# Patient Record
Sex: Female | Born: 1950 | Race: White | Hispanic: No | Marital: Single | Smoking: Never smoker
Health system: Southern US, Community
[De-identification: ages and names within clinical notes are randomized; demographics above are authoritative.]

## PROBLEM LIST (undated history)

## (undated) DIAGNOSIS — R140 Abdominal distension (gaseous): Secondary | ICD-10-CM

## (undated) DIAGNOSIS — R1115 Cyclical vomiting syndrome unrelated to migraine: Secondary | ICD-10-CM

## (undated) DIAGNOSIS — M8580 Other specified disorders of bone density and structure, unspecified site: Secondary | ICD-10-CM

## (undated) DIAGNOSIS — H59039 Cystoid macular edema following cataract surgery, unspecified eye: Secondary | ICD-10-CM

## (undated) DIAGNOSIS — Z8719 Personal history of other diseases of the digestive system: Secondary | ICD-10-CM

## (undated) DIAGNOSIS — K219 Gastro-esophageal reflux disease without esophagitis: Secondary | ICD-10-CM

## (undated) DIAGNOSIS — E785 Hyperlipidemia, unspecified: Secondary | ICD-10-CM

## (undated) DIAGNOSIS — M1990 Unspecified osteoarthritis, unspecified site: Secondary | ICD-10-CM

## (undated) DIAGNOSIS — G43109 Migraine with aura, not intractable, without status migrainosus: Secondary | ICD-10-CM

## (undated) DIAGNOSIS — E039 Hypothyroidism, unspecified: Secondary | ICD-10-CM

## (undated) DIAGNOSIS — F32A Depression, unspecified: Secondary | ICD-10-CM

## (undated) DIAGNOSIS — K2970 Gastritis, unspecified, without bleeding: Secondary | ICD-10-CM

## (undated) DIAGNOSIS — B9681 Helicobacter pylori [H. pylori] as the cause of diseases classified elsewhere: Secondary | ICD-10-CM

## (undated) DIAGNOSIS — F419 Anxiety disorder, unspecified: Secondary | ICD-10-CM

## (undated) DIAGNOSIS — Z87898 Personal history of other specified conditions: Secondary | ICD-10-CM

## (undated) DIAGNOSIS — R112 Nausea with vomiting, unspecified: Secondary | ICD-10-CM

## (undated) DIAGNOSIS — F329 Major depressive disorder, single episode, unspecified: Secondary | ICD-10-CM

## (undated) HISTORY — DX: Nausea with vomiting, unspecified: R11.2

## (undated) HISTORY — DX: Cystoid macular edema following cataract surgery, unspecified eye: H59.039

## (undated) HISTORY — DX: Cyclical vomiting syndrome unrelated to migraine: R11.15

## (undated) HISTORY — DX: Depression, unspecified: F32.A

## (undated) HISTORY — DX: Helicobacter pylori (H. pylori) as the cause of diseases classified elsewhere: B96.81

## (undated) HISTORY — DX: Other specified disorders of bone density and structure, unspecified site: M85.80

## (undated) HISTORY — DX: Personal history of other specified conditions: Z87.898

## (undated) HISTORY — DX: Migraine with aura, not intractable, without status migrainosus: G43.109

## (undated) HISTORY — DX: Gastro-esophageal reflux disease without esophagitis: K21.9

## (undated) HISTORY — DX: Hypothyroidism, unspecified: E03.9

## (undated) HISTORY — DX: Unspecified osteoarthritis, unspecified site: M19.90

## (undated) HISTORY — DX: Gastritis, unspecified, without bleeding: K29.70

## (undated) HISTORY — DX: Personal history of other diseases of the digestive system: Z87.19

## (undated) HISTORY — DX: Anxiety disorder, unspecified: F41.9

## (undated) HISTORY — DX: Major depressive disorder, single episode, unspecified: F32.9

## (undated) HISTORY — DX: Hyperlipidemia, unspecified: E78.5

## (undated) HISTORY — DX: Abdominal distension (gaseous): R14.0

---
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
## 2002-10-20 ENCOUNTER — Other Ambulatory Visit: Admission: RE | Admit: 2002-10-20 | Discharge: 2002-10-20 | Payer: Self-pay | Admitting: Family Medicine

## 2003-11-12 ENCOUNTER — Other Ambulatory Visit: Admission: RE | Admit: 2003-11-12 | Discharge: 2003-11-12 | Payer: Self-pay | Admitting: Family Medicine

## 2003-12-10 LAB — HM COLONOSCOPY

## 2004-05-12 ENCOUNTER — Ambulatory Visit: Payer: Self-pay | Admitting: Family Medicine

## 2004-05-17 ENCOUNTER — Ambulatory Visit: Payer: Self-pay | Admitting: Internal Medicine

## 2004-05-18 ENCOUNTER — Encounter: Admission: RE | Admit: 2004-05-18 | Discharge: 2004-05-18 | Payer: Self-pay | Admitting: Family Medicine

## 2004-09-27 ENCOUNTER — Ambulatory Visit: Payer: Self-pay | Admitting: Family Medicine

## 2004-12-27 ENCOUNTER — Ambulatory Visit: Payer: Self-pay | Admitting: Family Medicine

## 2005-01-03 ENCOUNTER — Ambulatory Visit: Payer: Self-pay | Admitting: Family Medicine

## 2005-01-03 ENCOUNTER — Other Ambulatory Visit: Admission: RE | Admit: 2005-01-03 | Discharge: 2005-01-03 | Payer: Self-pay | Admitting: Family Medicine

## 2005-03-19 ENCOUNTER — Ambulatory Visit: Payer: Self-pay | Admitting: Family Medicine

## 2005-04-18 ENCOUNTER — Ambulatory Visit: Payer: Self-pay | Admitting: Family Medicine

## 2005-04-27 ENCOUNTER — Ambulatory Visit: Payer: Self-pay | Admitting: Family Medicine

## 2005-10-12 ENCOUNTER — Ambulatory Visit: Payer: Self-pay | Admitting: Family Medicine

## 2006-01-04 ENCOUNTER — Ambulatory Visit: Payer: Self-pay | Admitting: Family Medicine

## 2006-01-11 ENCOUNTER — Encounter: Payer: Self-pay | Admitting: Family Medicine

## 2006-01-11 ENCOUNTER — Ambulatory Visit: Payer: Self-pay | Admitting: Family Medicine

## 2006-01-11 ENCOUNTER — Other Ambulatory Visit: Admission: RE | Admit: 2006-01-11 | Discharge: 2006-01-11 | Payer: Self-pay | Admitting: Family Medicine

## 2006-06-14 ENCOUNTER — Ambulatory Visit: Payer: Self-pay | Admitting: Family Medicine

## 2007-01-29 ENCOUNTER — Ambulatory Visit: Payer: Self-pay | Admitting: Family Medicine

## 2007-01-30 LAB — CONVERTED CEMR LAB
ALT: 24 units/L (ref 0–35)
AST: 21 units/L (ref 0–37)
Albumin: 4 g/dL (ref 3.5–5.2)
Alkaline Phosphatase: 50 units/L (ref 39–117)
BUN: 7 mg/dL (ref 6–23)
Basophils Absolute: 0 10*3/uL (ref 0.0–0.1)
Basophils Relative: 0.4 % (ref 0.0–1.0)
Bilirubin, Direct: 0.1 mg/dL (ref 0.0–0.3)
CO2: 32 meq/L (ref 19–32)
Calcium: 9.3 mg/dL (ref 8.4–10.5)
Chloride: 106 meq/L (ref 96–112)
Cholesterol: 177 mg/dL (ref 0–200)
Creatinine, Ser: 0.7 mg/dL (ref 0.4–1.2)
Eosinophils Absolute: 0.3 10*3/uL (ref 0.0–0.6)
Eosinophils Relative: 5.9 % — ABNORMAL HIGH (ref 0.0–5.0)
GFR calc Af Amer: 111 mL/min
GFR calc non Af Amer: 92 mL/min
Glucose, Bld: 100 mg/dL — ABNORMAL HIGH (ref 70–99)
HCT: 41.5 % (ref 36.0–46.0)
HDL: 43.2 mg/dL (ref >39.0)
Hemoglobin: 14.8 g/dL (ref 12.0–15.0)
LDL Cholesterol: 108 mg/dL — ABNORMAL HIGH (ref 0–99)
Lymphocytes Relative: 35.7 % (ref 12.0–46.0)
MCHC: 35.6 g/dL (ref 30.0–36.0)
MCV: 91.5 fL (ref 78.0–100.0)
Monocytes Absolute: 0.4 10*3/uL (ref 0.2–0.7)
Monocytes Relative: 8.9 % (ref 3.0–11.0)
Neutro Abs: 2.3 10*3/uL (ref 1.4–7.7)
Neutrophils Relative %: 49.1 % (ref 43.0–77.0)
Platelets: 208 10*3/uL (ref 150–400)
Potassium: 3.9 meq/L (ref 3.5–5.1)
RBC: 4.53 M/uL (ref 3.87–5.11)
RDW: 12.4 % (ref 11.5–14.6)
Sodium: 144 meq/L (ref 135–145)
TSH: 1.03 microintl units/mL (ref 0.35–5.50)
Total Bilirubin: 0.9 mg/dL (ref 0.3–1.2)
Total CHOL/HDL Ratio: 4.1
Total Protein: 6.7 g/dL (ref 6.0–8.3)
Triglycerides: 129 mg/dL (ref 0–149)
VLDL: 26 mg/dL (ref 0–40)
WBC: 4.6 10*3/uL (ref 4.5–10.5)

## 2007-02-05 ENCOUNTER — Other Ambulatory Visit: Admission: RE | Admit: 2007-02-05 | Discharge: 2007-02-05 | Payer: Self-pay | Admitting: Family Medicine

## 2007-02-05 ENCOUNTER — Encounter: Payer: Self-pay | Admitting: Family Medicine

## 2007-02-05 ENCOUNTER — Ambulatory Visit: Payer: Self-pay | Admitting: Family Medicine

## 2007-03-10 ENCOUNTER — Ambulatory Visit: Payer: Self-pay | Admitting: Family Medicine

## 2007-03-10 DIAGNOSIS — R110 Nausea: Secondary | ICD-10-CM

## 2007-06-30 ENCOUNTER — Ambulatory Visit: Payer: Self-pay | Admitting: Family Medicine

## 2007-06-30 DIAGNOSIS — R1013 Epigastric pain: Secondary | ICD-10-CM

## 2007-06-30 DIAGNOSIS — K219 Gastro-esophageal reflux disease without esophagitis: Secondary | ICD-10-CM | POA: Insufficient documentation

## 2007-07-09 ENCOUNTER — Encounter: Admission: RE | Admit: 2007-07-09 | Discharge: 2007-07-09 | Payer: Self-pay | Admitting: Family Medicine

## 2007-09-15 ENCOUNTER — Telehealth: Payer: Self-pay | Admitting: Family Medicine

## 2008-01-20 ENCOUNTER — Ambulatory Visit: Payer: Self-pay | Admitting: Family Medicine

## 2008-01-21 LAB — CONVERTED CEMR LAB
AST: 19 units/L (ref 0–37)
Albumin: 4 g/dL (ref 3.5–5.2)
Alkaline Phosphatase: 47 units/L (ref 39–117)
BUN: 11 mg/dL (ref 6–23)
Basophils Relative: 0.9 % (ref 0.0–3.0)
Blood in Urine, dipstick: NEGATIVE
Chloride: 111 meq/L (ref 96–112)
Creatinine, Ser: 0.8 mg/dL (ref 0.4–1.2)
Eosinophils Relative: 5.8 % — ABNORMAL HIGH (ref 0.0–5.0)
Glucose, Bld: 96 mg/dL (ref 70–99)
Glucose, Urine, Semiquant: NEGATIVE
HCT: 40.5 % (ref 36.0–46.0)
HDL: 31.7 mg/dL — ABNORMAL LOW (ref >39.0)
MCV: 93 fL (ref 78.0–100.0)
Monocytes Absolute: 0.4 10*3/uL (ref 0.1–1.0)
Monocytes Relative: 9.1 % (ref 3.0–12.0)
Neutrophils Relative %: 50.8 % (ref 43.0–77.0)
Nitrite: NEGATIVE
Platelets: 202 10*3/uL (ref 150–400)
Potassium: 3.9 meq/L (ref 3.5–5.1)
RBC: 4.35 M/uL (ref 3.87–5.11)
Specific Gravity, Urine: 1.015
TSH: 0.73 microintl units/mL (ref 0.35–5.50)
Total CHOL/HDL Ratio: 5.8
Total Protein: 6.6 g/dL (ref 6.0–8.3)
WBC: 4.3 10*3/uL — ABNORMAL LOW (ref 4.5–10.5)
pH: 7.5

## 2008-01-27 ENCOUNTER — Other Ambulatory Visit: Admission: RE | Admit: 2008-01-27 | Discharge: 2008-01-27 | Payer: Self-pay | Admitting: Family Medicine

## 2008-01-27 ENCOUNTER — Encounter: Payer: Self-pay | Admitting: Family Medicine

## 2008-01-27 ENCOUNTER — Ambulatory Visit: Payer: Self-pay | Admitting: Family Medicine

## 2008-01-27 DIAGNOSIS — E039 Hypothyroidism, unspecified: Secondary | ICD-10-CM

## 2008-01-28 ENCOUNTER — Ambulatory Visit: Payer: Self-pay | Admitting: Family Medicine

## 2008-01-28 ENCOUNTER — Encounter: Payer: Self-pay | Admitting: Family Medicine

## 2008-02-13 ENCOUNTER — Encounter: Payer: Self-pay | Admitting: Family Medicine

## 2008-03-26 ENCOUNTER — Ambulatory Visit: Payer: Self-pay | Admitting: Family Medicine

## 2008-03-26 DIAGNOSIS — M542 Cervicalgia: Secondary | ICD-10-CM

## 2008-06-10 ENCOUNTER — Ambulatory Visit: Payer: Self-pay | Admitting: Family Medicine

## 2008-06-10 DIAGNOSIS — N952 Postmenopausal atrophic vaginitis: Secondary | ICD-10-CM

## 2008-06-10 DIAGNOSIS — B373 Candidiasis of vulva and vagina: Secondary | ICD-10-CM

## 2008-06-23 ENCOUNTER — Telehealth: Payer: Self-pay | Admitting: Family Medicine

## 2008-10-21 ENCOUNTER — Telehealth: Payer: Self-pay | Admitting: Family Medicine

## 2008-10-22 ENCOUNTER — Telehealth: Payer: Self-pay | Admitting: Family Medicine

## 2008-12-16 ENCOUNTER — Ambulatory Visit: Payer: Self-pay | Admitting: Family Medicine

## 2008-12-16 DIAGNOSIS — R319 Hematuria, unspecified: Secondary | ICD-10-CM

## 2008-12-16 LAB — CONVERTED CEMR LAB
Nitrite: POSITIVE
Urobilinogen, UA: 0.2

## 2009-01-25 ENCOUNTER — Telehealth: Payer: Self-pay | Admitting: Family Medicine

## 2009-02-07 ENCOUNTER — Ambulatory Visit: Payer: Self-pay | Admitting: Family Medicine

## 2009-02-07 DIAGNOSIS — N390 Urinary tract infection, site not specified: Secondary | ICD-10-CM

## 2009-02-07 LAB — CONVERTED CEMR LAB
Blood in Urine, dipstick: NEGATIVE
Ketones, urine, test strip: NEGATIVE
Nitrite: NEGATIVE
Protein, U semiquant: NEGATIVE
Specific Gravity, Urine: 1.02
Urobilinogen, UA: 0.2

## 2009-02-09 ENCOUNTER — Encounter: Payer: Self-pay | Admitting: Family Medicine

## 2009-03-02 ENCOUNTER — Ambulatory Visit: Payer: Self-pay | Admitting: Family Medicine

## 2009-03-02 LAB — CONVERTED CEMR LAB
Bilirubin Urine: NEGATIVE
Ketones, urine, test strip: NEGATIVE
Protein, U semiquant: NEGATIVE
Urobilinogen, UA: 0.2

## 2009-03-03 LAB — CONVERTED CEMR LAB
BUN: 12 mg/dL (ref 6–23)
Bilirubin, Direct: 0 mg/dL (ref 0.0–0.3)
CO2: 30 meq/L (ref 19–32)
Chloride: 103 meq/L (ref 96–112)
Cholesterol: 165 mg/dL (ref 0–200)
Creatinine, Ser: 0.8 mg/dL (ref 0.4–1.2)
Eosinophils Absolute: 0.2 10*3/uL (ref 0.0–0.7)
HDL: 43.5 mg/dL (ref >39.00)
LDL Cholesterol: 103 mg/dL — ABNORMAL HIGH (ref 0–99)
MCHC: 34.8 g/dL (ref 30.0–36.0)
MCV: 95.2 fL (ref 78.0–100.0)
Monocytes Absolute: 0.5 10*3/uL (ref 0.1–1.0)
Neutrophils Relative %: 48.9 % (ref 43.0–77.0)
Platelets: 178 10*3/uL (ref 150.0–400.0)
Total Bilirubin: 1.1 mg/dL (ref 0.3–1.2)
Triglycerides: 92 mg/dL (ref 0.0–149.0)
WBC: 4.1 10*3/uL — ABNORMAL LOW (ref 4.5–10.5)

## 2009-03-08 ENCOUNTER — Ambulatory Visit: Payer: Self-pay | Admitting: Family Medicine

## 2009-03-08 ENCOUNTER — Encounter: Payer: Self-pay | Admitting: Family Medicine

## 2009-03-08 ENCOUNTER — Other Ambulatory Visit: Admission: RE | Admit: 2009-03-08 | Discharge: 2009-03-08 | Payer: Self-pay | Admitting: Family Medicine

## 2009-03-11 ENCOUNTER — Encounter: Payer: Self-pay | Admitting: Family Medicine

## 2009-03-15 ENCOUNTER — Telehealth (INDEPENDENT_AMBULATORY_CARE_PROVIDER_SITE_OTHER): Payer: Self-pay | Admitting: *Deleted

## 2009-04-01 ENCOUNTER — Encounter (INDEPENDENT_AMBULATORY_CARE_PROVIDER_SITE_OTHER): Payer: Self-pay | Admitting: *Deleted

## 2009-06-20 ENCOUNTER — Ambulatory Visit: Payer: Self-pay | Admitting: Family Medicine

## 2009-06-20 DIAGNOSIS — L259 Unspecified contact dermatitis, unspecified cause: Secondary | ICD-10-CM

## 2009-08-29 ENCOUNTER — Ambulatory Visit: Payer: Self-pay | Admitting: Family Medicine

## 2009-08-29 DIAGNOSIS — IMO0002 Reserved for concepts with insufficient information to code with codable children: Secondary | ICD-10-CM | POA: Insufficient documentation

## 2010-04-18 ENCOUNTER — Encounter: Payer: Self-pay | Admitting: Family Medicine

## 2010-05-30 ENCOUNTER — Telehealth: Payer: Self-pay | Admitting: Family Medicine

## 2010-06-09 ENCOUNTER — Other Ambulatory Visit: Payer: Self-pay | Admitting: Family Medicine

## 2010-06-09 ENCOUNTER — Ambulatory Visit
Admission: RE | Admit: 2010-06-09 | Discharge: 2010-06-09 | Payer: Self-pay | Source: Home / Self Care | Attending: Family Medicine | Admitting: Family Medicine

## 2010-06-09 LAB — CBC WITH DIFFERENTIAL/PLATELET
Basophils Absolute: 0 10*3/uL (ref 0.0–0.1)
Basophils Relative: 0.8 % (ref 0.0–3.0)
Eosinophils Absolute: 0.3 10*3/uL (ref 0.0–0.7)
Eosinophils Relative: 5.9 % — ABNORMAL HIGH (ref 0.0–5.0)
HCT: 41 % (ref 36.0–46.0)
Hemoglobin: 14.1 g/dL (ref 12.0–15.0)
Lymphocytes Relative: 42.5 % (ref 12.0–46.0)
Lymphs Abs: 2.1 10*3/uL (ref 0.7–4.0)
MCHC: 34.3 g/dL (ref 30.0–36.0)
MCV: 95.3 fl (ref 78.0–100.0)
Monocytes Absolute: 0.5 10*3/uL (ref 0.1–1.0)
Monocytes Relative: 10.6 % (ref 3.0–12.0)
Neutro Abs: 2 10*3/uL (ref 1.4–7.7)
Neutrophils Relative %: 40.2 % — ABNORMAL LOW (ref 43.0–77.0)
Platelets: 199 10*3/uL (ref 150.0–400.0)
RBC: 4.3 Mil/uL (ref 3.87–5.11)
RDW: 13.3 % (ref 11.5–14.6)
WBC: 4.9 10*3/uL (ref 4.5–10.5)

## 2010-06-09 LAB — HEPATIC FUNCTION PANEL
ALT: 21 U/L (ref 0–35)
AST: 22 U/L (ref 0–37)
Albumin: 4 g/dL (ref 3.5–5.2)
Alkaline Phosphatase: 43 U/L (ref 39–117)
Bilirubin, Direct: 0.1 mg/dL (ref 0.0–0.3)
Total Bilirubin: 0.9 mg/dL (ref 0.3–1.2)
Total Protein: 6.4 g/dL (ref 6.0–8.3)

## 2010-06-09 LAB — LIPID PANEL
Cholesterol: 286 mg/dL — ABNORMAL HIGH (ref 0–200)
HDL: 47.1 mg/dL (ref >39.00)
Total CHOL/HDL Ratio: 6
Triglycerides: 126 mg/dL (ref 0.0–149.0)
VLDL: 25.2 mg/dL (ref 0.0–40.0)

## 2010-06-09 LAB — CONVERTED CEMR LAB
Bilirubin Urine: NEGATIVE
Glucose, Urine, Semiquant: NEGATIVE
Ketones, urine, test strip: NEGATIVE
Nitrite: NEGATIVE
Specific Gravity, Urine: 1.025
pH: 5.5

## 2010-06-09 LAB — BASIC METABOLIC PANEL
BUN: 11 mg/dL (ref 6–23)
CO2: 31 mEq/L (ref 19–32)
Calcium: 9.4 mg/dL (ref 8.4–10.5)
Chloride: 104 mEq/L (ref 96–112)
Creatinine, Ser: 0.9 mg/dL (ref 0.4–1.2)
GFR: 70.68 mL/min (ref >60.00)
Glucose, Bld: 85 mg/dL (ref 70–99)
Potassium: 4.1 mEq/L (ref 3.5–5.1)
Sodium: 142 mEq/L (ref 135–145)

## 2010-06-09 LAB — TSH: TSH: 2.3 u[IU]/mL (ref 0.35–5.50)

## 2010-06-09 LAB — LDL CHOLESTEROL, DIRECT: Direct LDL: 224.1 mg/dL

## 2010-06-27 ENCOUNTER — Encounter: Payer: Self-pay | Admitting: Family Medicine

## 2010-06-29 NOTE — Assessment & Plan Note (Signed)
Summary: ab and back pain//ccm   Vital Signs:  Patient profile:   60 year old female Weight:      143 pounds Temp:     98.1 degrees F BP sitting:   110 / 74  (left arm) Cuff size:   regular  Vitals Entered By: Duard Brady LPN (August 29, 1608 9:13 AM) CC: c/o (R) back and flank pain x 2 days   otc not helping Is Patient Diabetic? No   History of Present Illness: 5 days ago while doing some hard exercises in her core class she had a sudden sharp pain in the right mid bakc and right flank. This has persisted since.  Ice and Motrin help. No change in bowel or urinary habits. No fever or nausea. Appetite is good, and eating has no effect on the pain.   Preventive Screening-Counseling & Management  Alcohol-Tobacco     Smoking Status: never  Allergies (verified): No Known Drug Allergies  Past History:  Past Medical History: Reviewed history from 01/27/2008 and no changes required. Depression High Cholesterol UTI Hypothyroidism Anxiety GERD  Past Surgical History: Reviewed history from 01/27/2008 and no changes required. Colonoscopy 12-10-03 per Dr. Leone Payor, repeat in 10 yrs  Review of Systems  The patient denies anorexia, fever, weight loss, weight gain, vision loss, decreased hearing, hoarseness, chest pain, syncope, dyspnea on exertion, peripheral edema, prolonged cough, headaches, hemoptysis, melena, hematochezia, severe indigestion/heartburn, hematuria, incontinence, genital sores, muscle weakness, suspicious skin lesions, transient blindness, difficulty walking, depression, unusual weight change, abnormal bleeding, enlarged lymph nodes, angioedema, breast masses, and testicular masses.    Physical Exam  General:  Well-developed,well-nourished,in no acute distress; alert,appropriate and cooperative throughout examination Lungs:  Normal respiratory effort, chest expands symmetrically. Lungs are clear to auscultation, no crackles or wheezes. Heart:  Normal rate and  regular rhythm. S1 and S2 normal without gallop, murmur, click, rub or other extra sounds. Abdomen:  soft, normal bowel sounds, no distention, no masses, no guarding, no rigidity, no rebound tenderness, no abdominal hernia, no inguinal hernia, no hepatomegaly, and no splenomegaly.  Mildly tender in the right mid back at the inferior rib margin   Impression & Recommendations:  Problem # 1:  MUSCLE STRAIN, ABDOMINAL WALL (ICD-848.8)  Complete Medication List: 1)  Synthroid 50 Mcg Tabs (Levothyroxine sodium) .... Once daily 2)  Celexa 20 Mg Tabs (Citalopram hydrobromide) .... Once daily 3)  Alprazolam 0.25 Mg Tabs (Alprazolam) .... Two times a day as needed 4)  Lipitor 40 Mg Tabs (Atorvastatin calcium) .... Take 1 tab by mouth daily 5)  Premarin 0.625 Mg/gm Crea (Estrogens, conjugated) .... Uad 6)  Triamcinolone Acetonide 0.1 % Crea (Triamcinolone acetonide) .... Apply two times a day as needed eczema  Patient Instructions: 1)  rest, ice , and Motrin as needed . No core exercises for several weeks.  2)  Please schedule a follow-up appointment as needed .

## 2010-06-29 NOTE — Assessment & Plan Note (Signed)
Summary: RASH THROUGHOUT BODY X 2 MTHS // RS   Vital Signs:  Patient profile:   60 year old female Weight:      143 pounds Temp:     98.6 degrees F oral Pulse rate:   108 / minute BP sitting:   114 / 76  (left arm) Cuff size:   regular  Vitals Entered By: Alfred Levins, CMA (June 20, 2009 4:39 PM) CC: itchy rash on rt leg and stomach x1 mth   History of Present Illness: here for 2 months of itchy rashes on the trunk and legs. using moisturizers and OTC cortisone creams.   Allergies (verified): No Known Drug Allergies  Past History:  Past Medical History: Reviewed history from 01/27/2008 and no changes required. Depression High Cholesterol UTI Hypothyroidism Anxiety GERD  Review of Systems  The patient denies anorexia, fever, weight loss, weight gain, vision loss, decreased hearing, hoarseness, chest pain, syncope, dyspnea on exertion, peripheral edema, prolonged cough, headaches, hemoptysis, abdominal pain, melena, hematochezia, severe indigestion/heartburn, hematuria, incontinence, genital sores, muscle weakness, suspicious skin lesions, transient blindness, difficulty walking, depression, unusual weight change, abnormal bleeding, enlarged lymph nodes, angioedema, breast masses, and testicular masses.    Physical Exam  General:  Well-developed,well-nourished,in no acute distress; alert,appropriate and cooperative throughout examination Skin:  patches of macular dry ertythematous rash on both shins and the trunk   Impression & Recommendations:  Problem # 1:  ECZEMA (ICD-692.9)  Her updated medication list for this problem includes:    Triamcinolone Acetonide 0.1 % Crea (Triamcinolone acetonide) .Marland Kitchen... Apply two times a day as needed eczema  Complete Medication List: 1)  Synthroid 50 Mcg Tabs (Levothyroxine sodium) .... Once daily 2)  Celexa 20 Mg Tabs (Citalopram hydrobromide) .... Once daily 3)  Alprazolam 0.25 Mg Tabs (Alprazolam) .... Two times a day as  needed 4)  Lipitor 40 Mg Tabs (Atorvastatin calcium) .... Take 1 tab by mouth daily 5)  Premarin 0.625 Mg/gm Crea (Estrogens, conjugated) .... Uad 6)  Triamcinolone Acetonide 0.1 % Crea (Triamcinolone acetonide) .... Apply two times a day as needed eczema  Patient Instructions: 1)  Please schedule a follow-up appointment as needed .  Prescriptions: TRIAMCINOLONE ACETONIDE 0.1 % CREA (TRIAMCINOLONE ACETONIDE) apply two times a day as needed eczema  #60 x 5   Entered and Authorized by:   Nelwyn Salisbury MD   Signed by:   Nelwyn Salisbury MD on 06/20/2009   Method used:   Electronically to        [facility]  [phone]* (retail)       [address]       [place], [place]  [address]       Ph: [phone] or [phone]       Fax: [phone]   RxID:   [id number]

## 2010-06-29 NOTE — Progress Notes (Signed)
Summary: Pt req to change to generic Lipitor  Phone Note Call from Patient Call back at Home Phone [phone]   Caller: Patient Summary of Call: Pt called and would like to change to generic Lipitor. Pls call in to [facility] on [address].  Initial call taken by: Lucy Antigua,  May 30, 2010 1:26 PM  Follow-up for Phone Call        she is past due for fasting labs to check her lipids. Set up cpx labs soon  Follow-up by: Nelwyn Salisbury MD,  June 01, 2010 1:05 PM  Additional Follow-up for Phone Call Additional follow up Details #1::        Lft vm for pt to cb to sch labs as noted. Waiting on call back.  Additional Follow-up by: Lucy Antigua,  June 02, 2010 2:21 PM    Additional Follow-up for Phone Call Additional follow up Details #2::    Pt called back and has sch cpx labs for 06/09/10 at 8:15am. Pt had sch her cpx for 07/24/10 prior to making lab appt.  Follow-up by: Lucy Antigua,  June 02, 2010 2:26 PM

## 2010-07-11 ENCOUNTER — Telehealth: Payer: Self-pay | Admitting: Family Medicine

## 2010-07-11 NOTE — Telephone Encounter (Signed)
Left message to call back  

## 2010-07-11 NOTE — Telephone Encounter (Signed)
Pt is sch for cpx on 07/24/10 and pt is req to get a bone density scan done during prior history of oseteopina. Pls advise.

## 2010-07-11 NOTE — Telephone Encounter (Signed)
I will order this during the visit

## 2010-07-24 ENCOUNTER — Ambulatory Visit (INDEPENDENT_AMBULATORY_CARE_PROVIDER_SITE_OTHER): Payer: 59 | Admitting: Family Medicine

## 2010-07-24 ENCOUNTER — Encounter: Payer: Self-pay | Admitting: Family Medicine

## 2010-07-24 VITALS — BP 114/82 | HR 86 | Temp 98.6°F | Resp 16 | Wt 144.0 lb

## 2010-07-24 DIAGNOSIS — M949 Disorder of cartilage, unspecified: Secondary | ICD-10-CM

## 2010-07-24 DIAGNOSIS — Z124 Encounter for screening for malignant neoplasm of cervix: Secondary | ICD-10-CM

## 2010-07-24 DIAGNOSIS — M899 Disorder of bone, unspecified: Secondary | ICD-10-CM | POA: Insufficient documentation

## 2010-07-24 MED ORDER — ESTROGENS, CONJUGATED 0.625 MG/GM VA CREA: TOPICAL_CREAM | Freq: Every day | VAGINAL | Status: DC

## 2010-07-24 MED ORDER — LORAZEPAM 0.5 MG PO TABS: 0.5000 mg | ORAL_TABLET | Freq: Four times a day (QID) | ORAL | Status: DC | PRN

## 2010-07-24 NOTE — Progress Notes (Signed)
Subjective:    Patient ID: Brandy Boyle, female    DOB: 12-14-1950, 60 y.o.   MRN: [id number]  HPI 60 yr old female for a cpx. She feels good with no complaints. She retired from her job last week and is excited to have some free time.   Review of Systems  Constitutional: Negative.  Negative for fever, diaphoresis, activity change, appetite change, fatigue and unexpected weight change.  HENT: Negative.  Negative for hearing loss, ear pain, nosebleeds, congestion, sore throat, trouble swallowing, neck pain, neck stiffness, voice change and tinnitus.   Eyes: Negative.  Negative for photophobia, pain, discharge, redness and visual disturbance.  Respiratory: Negative.  Negative for apnea, cough, choking, chest tightness, shortness of breath, wheezing and stridor.   Cardiovascular: Negative.  Negative for chest pain, palpitations and leg swelling.  Gastrointestinal: Negative.  Negative for nausea, vomiting, abdominal pain, diarrhea, constipation, blood in stool, abdominal distention and rectal pain.  Genitourinary: Negative.  Negative for dysuria, urgency, frequency, hematuria, flank pain, scrotal swelling, vaginal bleeding, vaginal discharge, enuresis, difficulty urinating, testicular pain, vaginal pain and menstrual problem.  Musculoskeletal: Negative.  Negative for myalgias, back pain, joint swelling, arthralgias and gait problem.  Skin: Negative.  Negative for color change, pallor, rash and wound.  Neurological: Negative.  Negative for dizziness, tremors, seizures, syncope, speech difficulty, weakness, light-headedness, numbness and headaches.  Hematological: Negative.  Negative for adenopathy. Does not bruise/bleed easily.  Psychiatric/Behavioral: Negative.  Negative for hallucinations, behavioral problems, confusion, sleep disturbance, dysphoric mood and agitation. The patient is not nervous/anxious.        Objective:   Physical Exam  Constitutional: She appears well-developed and  well-nourished. No distress.  HENT:  Head: Normocephalic and atraumatic.  Right Ear: External ear normal.  Left Ear: External ear normal.  Nose: Nose normal.  Mouth/Throat: Oropharynx is clear and moist. No oropharyngeal exudate.  Eyes: Conjunctivae and EOM are normal. Pupils are equal, round, and reactive to light. Right eye exhibits no discharge. Left eye exhibits no discharge. No scleral icterus.  Neck: Normal range of motion. Neck supple. No JVD present. No thyromegaly present.  Cardiovascular: Normal rate, regular rhythm, normal heart sounds and intact distal pulses.  Exam reveals no gallop and no friction rub.   No murmur heard. Pulmonary/Chest: Effort normal and breath sounds normal. No stridor. No respiratory distress. She has no wheezes. She has no rales. She exhibits no tenderness.  Abdominal: Soft. Normal appearance and bowel sounds are normal. She exhibits no distension, no abdominal bruit, no ascites and no mass. There is no hepatosplenomegaly. There is no tenderness. There is no rigidity, no rebound and no guarding. No hernia.  Genitourinary: Rectum normal, vagina normal and uterus normal. No breast swelling, tenderness, discharge or bleeding. Cervix exhibits no motion tenderness, no discharge and no friability. Right adnexum displays no mass, no tenderness and no fullness. Left adnexum displays no mass, no tenderness and no fullness. No erythema, tenderness or bleeding around the vagina. No vaginal discharge found.  Musculoskeletal: Normal range of motion. She exhibits no edema and no tenderness.  Lymphadenopathy:    She has no cervical adenopathy.  Neurological: She is alert. She has normal reflexes. No cranial nerve deficit. She exhibits normal muscle tone. Coordination normal.  Skin: Skin is warm and dry. No rash noted. She is not diaphoretic. No erythema. No pallor.  Psychiatric: She has a normal mood and affect. Her behavior is normal. Judgment and thought content normal.  Assessment & Plan:  Well exam. Set up another DEXA

## 2010-08-03 NOTE — Miscellaneous (Signed)
Summary: Orders Update  Clinical Lists Changes  Problems: Added new problem of OSTEOPENIA (ICD-733.90) Orders: Added new Test order of T-Bone Densitometry [id number]) - Signed Added new Test order of T-Lumbar Vertebral Assessment [phone]) - Signed

## 2010-10-13 NOTE — Assessment & Plan Note (Signed)
[facility] OFFICE NOTE   Brandy Boyle, Brandy Boyle                      MRN:          [id number]  DATE:01/11/2006                            DOB:          09-27-1950    This is a 59 year old woman here for complete physical examination.  In  general she is doing well and has no complaints at all.  She had a  colonoscopy which was unremarkable in July of 2005 and a ten-year followup  was recommended.  She continues to be treated for hyperlipidemia, mild  depression and hypothyroidism.  Her depression is doing quite well on her  current regimen.  She has already scheduled her screening mammogram which  will be coming up in the next  couple of weeks.   PAST MEDICAL HISTORY, FAMILY HISTORY, SOCIAL HISTORY:  Refer to her last  physical note dated January 03, 2005.   ALLERGIES:  None.   CURRENT MEDICATIONS:  1. Lipitor 40 mg per day.  2. Synthroid 50 micrograms per day.  3. Celexa 20 mg 1/2-tablet per day.  4. Xanax 0.25 mg as needed for air-plane travel.   OBJECTIVE:  VITAL SIGNS:  Height 5 feet 1 inch.  Weight 141.  Blood pressure  120/80.  Pulse 72 and regular.  GENERAL:  She appears to be doing well.  SKIN:  Free of significant lesions.  EYES:  Clear.  EARS:  Clear.  THROAT:  Pharynx clear.  NECK:  Supple without lymphadenopathy or masses.  LUNGS:  Clear.  CARDIAC:  Regular rate and rhythm without gallops, murmurs, rubs.  Distal  pulses are full. EKG is within normal limits.  BREASTS/AXILLAE:  Clear.  ABDOMEN: Soft, normal bowel sounds.  Nontender, no masses.  PELVIC:  External genitalia within normal limits.  Vagina is clear.  Cervix  is clear.  Pap smear is obtained.  Uterus not enlarged, no adnexal mass or  tenderness.  RECTAL:  No mass or tenderness.  Stool Hemoccult-negative.  EXTREMITIES: No clubbing, cyanosis or edema.  NEUROLOGICAL:  Exam grossly intact.   LABORATORY DATA:  She was here for  fasting laboratories on August 10.  These  are all completely within normal limits.   ASSESSMENT/PLAN:  1. Complete physical exam.  I encouraged her to get more regular exercise      and will await results from upcoming mammogram.  2. Depression, stable.  3. Hyperlipidemia, stable.  4. Hypothyroidism, stable.                                   Tera Mater. Clent Ridges, MD   SAF/MedQ  DD:  01/11/2006  DT:  01/12/2006  Job #:  [id number]

## 2010-11-07 ENCOUNTER — Encounter: Payer: Self-pay | Admitting: Internal Medicine

## 2010-11-07 ENCOUNTER — Ambulatory Visit (INDEPENDENT_AMBULATORY_CARE_PROVIDER_SITE_OTHER): Payer: 59 | Admitting: Internal Medicine

## 2010-11-07 DIAGNOSIS — L259 Unspecified contact dermatitis, unspecified cause: Secondary | ICD-10-CM

## 2010-11-07 DIAGNOSIS — J069 Acute upper respiratory infection, unspecified: Secondary | ICD-10-CM

## 2010-11-07 MED ORDER — DOXYCYCLINE HYCLATE 100 MG PO TABS: 100.0000 mg | ORAL_TABLET | Freq: Two times a day (BID) | ORAL | Status: AC

## 2010-11-12 DIAGNOSIS — J069 Acute upper respiratory infection, unspecified: Secondary | ICD-10-CM | POA: Insufficient documentation

## 2010-11-12 NOTE — Progress Notes (Signed)
  Subjective:    Patient ID: Brandy Boyle, female    DOB: 11/21/1950, 60 y.o.   MRN: [id number]  HPI Patient presents to clinic for evaluation of cough. Several day history of cough, congestion, ear discomfort. Cough is productive for yellow sputum without hemoptysis. Has had low grade fever without chills. Denies shortness of breath or wheezing. No known sick exposure. Does note red eyes without mucopurulent discharge. No exacerbating or alleviating factors. No other complaints.  Reviewed past medical history, medications and allergies  Review of Systems See history of present illness     Objective:   Physical Exam  Nursing note and vitals reviewed. Constitutional: She appears well-developed and well-nourished. No distress.  HENT:  Head: Normocephalic and atraumatic.  Right Ear: External ear normal.  Left Ear: External ear normal.  Nose: Nose normal.  Mouth/Throat: Oropharynx is clear and moist. No oropharyngeal exudate.  Eyes: Right eye exhibits no discharge. Left eye exhibits no discharge. Right conjunctiva is injected. Right conjunctiva has no hemorrhage. Left conjunctiva is injected. Left conjunctiva has no hemorrhage. No scleral icterus.  Cardiovascular: Normal rate, regular rhythm and normal heart sounds.   Pulmonary/Chest: Effort normal and breath sounds normal. No respiratory distress. She has no wheezes. She has no rales.  Neurological: She is alert.  Skin: Skin is warm and dry. She is not diaphoretic.  Psychiatric: She has a normal mood and affect.          Assessment & Plan:

## 2010-11-12 NOTE — Assessment & Plan Note (Signed)
Suspect viral etiology. Monitor eyes for development of mucopurulent discharge or failure to improve. Given p.o. Antibiotic if overall upper respirations symptoms did not improve after total duration of 8-10 days.Followup if no improvement or worsening.

## 2010-12-05 ENCOUNTER — Encounter: Payer: Self-pay | Admitting: Internal Medicine

## 2010-12-05 ENCOUNTER — Ambulatory Visit (INDEPENDENT_AMBULATORY_CARE_PROVIDER_SITE_OTHER): Payer: 59 | Admitting: Internal Medicine

## 2010-12-05 VITALS — BP 120/80 | Temp 98.9°F | Wt 138.0 lb

## 2010-12-05 DIAGNOSIS — N390 Urinary tract infection, site not specified: Secondary | ICD-10-CM

## 2010-12-05 LAB — POCT URINALYSIS DIPSTICK
Bilirubin, UA: NEGATIVE
Nitrite, UA: NEGATIVE
Protein, UA: NEGATIVE
Urobilinogen, UA: NEGATIVE
pH, UA: 7

## 2010-12-05 MED ORDER — CIPROFLOXACIN HCL 500 MG PO TABS: 500.0000 mg | ORAL_TABLET | Freq: Two times a day (BID) | ORAL | Status: AC

## 2010-12-05 NOTE — Progress Notes (Signed)
  Subjective:    Patient ID: Brandy Boyle, female    DOB: Jan 09, 1951, 60 y.o.   MRN: [id number]  HPI Pt presents to clinic for evaluation of possible UTI. Notes 2d h/o urinary urgency, frequency and dysuria without N/V, f/c, hematuria, abdominal pain or back pain. No alleviating or exacerbating factors. Has increased water intake. Is taking no medications for this problem. No h/o recurrent UTI's. No other complaints.  Reviewed pmh, medications and allergies    Review of Systems see hpi     Objective:   Physical Exam  Nursing note and vitals reviewed. Constitutional: She appears well-developed and well-nourished. No distress.  HENT:  Head: Normocephalic and atraumatic.  Neurological: She is alert.  Skin: She is not diaphoretic.  Psychiatric: She has a normal mood and affect.          Assessment & Plan:

## 2010-12-05 NOTE — Assessment & Plan Note (Signed)
Begin abx tx. Followup if no improvement or worsening.  

## 2010-12-18 ENCOUNTER — Other Ambulatory Visit: Payer: Self-pay | Admitting: Family Medicine

## 2010-12-19 ENCOUNTER — Telehealth: Payer: Self-pay | Admitting: *Deleted

## 2010-12-19 ENCOUNTER — Ambulatory Visit (INDEPENDENT_AMBULATORY_CARE_PROVIDER_SITE_OTHER): Payer: 59 | Admitting: Family Medicine

## 2010-12-19 ENCOUNTER — Encounter: Payer: Self-pay | Admitting: Family Medicine

## 2010-12-19 VITALS — BP 122/80 | HR 82 | Temp 98.7°F | Wt 140.0 lb

## 2010-12-19 DIAGNOSIS — S93609A Unspecified sprain of unspecified foot, initial encounter: Secondary | ICD-10-CM

## 2010-12-19 DIAGNOSIS — S60229A Contusion of unspecified hand, initial encounter: Secondary | ICD-10-CM

## 2010-12-19 NOTE — Progress Notes (Signed)
  Subjective:    Patient ID: Brandy Boyle, female    DOB: 15-Aug-1950, 60 y.o.   MRN: [id number]  HPI Here for 2 injuries. First on 12-15-10 she slipped on a boat and fell, landing on her left hand. The base of the thumb immediately swelled and was painful. It bruised at first, but now this is fading. She is icing it down every day and taking Advil. Then on 12-17-10 she slipped off a curb and fell, twisting her right ankle and scraping the side of the foot on the curb. Using ice here also. Still has swelling and bruising and some pain on the lateral foot.    Review of Systems  Constitutional: Negative.   Musculoskeletal: Positive for joint swelling.       Objective:   Physical Exam  Constitutional: She appears well-developed and well-nourished.  Musculoskeletal:       The base of the left thumb is swollen, tender, and echymotic. Full ROM. The lateral right foot and malleolus are mildly swollen, ecchymotic, and tender. Full ROM, No crepitus          Assessment & Plan:  I think these both were only severe contusions or sprains, but will send her for Xrays tomorrow. Try wearing a Neoprene sleeve on the right ankle. Take Aleve prn

## 2010-12-19 NOTE — Telephone Encounter (Signed)
Sprained ankle on Sunday and is having a lot of swelling.  Scheduled appt this afternoon.

## 2010-12-20 ENCOUNTER — Ambulatory Visit (INDEPENDENT_AMBULATORY_CARE_PROVIDER_SITE_OTHER)
Admission: RE | Admit: 2010-12-20 | Discharge: 2010-12-20 | Disposition: A | Discharge status: Home / Self Care | Payer: 59 | Source: Ambulatory Visit | Attending: Family Medicine | Admitting: Family Medicine

## 2010-12-20 DIAGNOSIS — S60229A Contusion of unspecified hand, initial encounter: Secondary | ICD-10-CM

## 2010-12-20 DIAGNOSIS — S93609A Unspecified sprain of unspecified foot, initial encounter: Secondary | ICD-10-CM

## 2010-12-20 NOTE — Progress Notes (Signed)
Addended by: Aniceto Boss A on: 12/20/2010 11:42 AM   Modules accepted: Orders

## 2010-12-21 ENCOUNTER — Telehealth: Payer: Self-pay | Admitting: Family Medicine

## 2010-12-21 NOTE — Telephone Encounter (Signed)
Requesting xray results from yesterday. Thanks.

## 2010-12-21 NOTE — Telephone Encounter (Signed)
Pt aware of results 

## 2011-01-24 ENCOUNTER — Other Ambulatory Visit: Payer: Self-pay | Admitting: Family Medicine

## 2011-01-24 NOTE — Telephone Encounter (Signed)
Script sent e-scribe 

## 2011-02-09 ENCOUNTER — Other Ambulatory Visit: Payer: Self-pay | Admitting: Family Medicine

## 2011-02-09 NOTE — Telephone Encounter (Signed)
Refill request for Lorazepam 0.5 mg take 1 po q6hrs prn. Pt last here on 12/19/10 and script last filled on 01/01/11.

## 2011-02-09 NOTE — Telephone Encounter (Signed)
Call in #60 with 5 rf 

## 2011-02-13 NOTE — Telephone Encounter (Signed)
Script called in

## 2011-05-08 ENCOUNTER — Encounter: Payer: Self-pay | Admitting: Family Medicine

## 2011-05-20 ENCOUNTER — Other Ambulatory Visit: Payer: Self-pay | Admitting: Family Medicine

## 2011-06-01 ENCOUNTER — Telehealth: Payer: Self-pay | Admitting: Family Medicine

## 2011-06-01 NOTE — Telephone Encounter (Signed)
Pt came by office in person. She said that she has had the flu for 2 weeks, still coughing, some chest congestion and no fever. I suggested the Saturday clinic at [facility]. We were full today and could not do a work in and Dr. Clent Ridges is aware. Pt did agree to see if there was any openings for tomorrow.

## 2011-06-01 NOTE — Telephone Encounter (Signed)
**  Pt Walk In**  Cough and congestion.  Has been taking muccinex for that last 2 weeks with no relief.  No fever, ?? pneumonia.

## 2011-06-04 ENCOUNTER — Encounter: Payer: Self-pay | Admitting: Family

## 2011-06-04 ENCOUNTER — Ambulatory Visit (INDEPENDENT_AMBULATORY_CARE_PROVIDER_SITE_OTHER): Payer: 59 | Admitting: Family

## 2011-06-04 VITALS — BP 108/60 | Temp 98.6°F | Wt 136.0 lb

## 2011-06-04 DIAGNOSIS — J019 Acute sinusitis, unspecified: Secondary | ICD-10-CM

## 2011-06-04 MED ORDER — AMOXICILLIN-POT CLAVULANATE 875-125 MG PO TABS: 1.0000 | ORAL_TABLET | Freq: Two times a day (BID) | ORAL | Status: AC

## 2011-06-04 NOTE — Progress Notes (Signed)
Subjective:    Patient ID: Brandy Boyle, female    DOB: 12-May-1951, 61 y.o.   MRN: [id number]  HPI 61 year old female, nonsmoker, patient of Dr. Clent Ridges and today with cough, congestion, fever, chills and achiness has been going on for about 2 weeks. She's been taking Mucinex and initially was on her symptoms, but is no longer helping. She reports a large amount of purulent drainage from her nostrils last night while she was in a shower. She has a bad taste in her mouth. Denies any chest pain or shortness of breath.   Review of Systems  Constitutional: Positive for fever and fatigue.  HENT: Positive for congestion, sneezing and sinus pressure.   Respiratory: Negative.   Cardiovascular: Negative.   Musculoskeletal: Negative.   Neurological: Negative.   Hematological: Negative.   Psychiatric/Behavioral: Negative.        Past Medical History  Diagnosis Date  . Depression   . Hyperlipidemia   . GERD (gastroesophageal reflux disease)   . Anxiety   . Hypothyroidism     History   Social History  . Marital Status: Single    Spouse Name: N/A    Number of Children: N/A  . Years of Education: N/A   Occupational History  . Not on file.   Social History Main Topics  . Smoking status: Never Smoker   . Smokeless tobacco: Not on file  . Alcohol Use: Yes  . Drug Use: No  . Sexually Active:    Other Topics Concern  . Not on file   Social History Narrative  . No narrative on file    No past surgical history on file.  Family History  Problem Relation Age of Onset  . Alcohol abuse      Family Hx  . Arthritis      Family Hx  . Breast cancer      1st degree  relative <50  . Colon cancer      1st degree relative <60  . Diabetes      1st degree relative  . Hypertension      Family History   . Stroke      Family Hx 1st degree <50  . Heart disease      Family Hx  . Cholecystitis Sister     sister had  cholectectomy    No Known Allergies  Current Outpatient  Prescriptions on File Prior to Visit  Medication Sig Dispense Refill  . atorvastatin (LIPITOR) 80 MG tablet TAKE ONE TABLET BY MOUTH EVERY DAY  30 tablet  3  . citalopram (CELEXA) 20 MG tablet TAKE ONE TABLET BY MOUTH EVERY DAY  90 tablet  11  . levothyroxine (SYNTHROID, LEVOTHROID) 50 MCG tablet TAKE ONE TABLET BY MOUTH EVERY DAY  90 tablet  6  . LORazepam (ATIVAN) 0.5 MG tablet TAKE ONE TABLET BY MOUTH EVERY 6 HOURS AS NEEDED FOR ANXIETY  60 tablet  5  . triamcinolone (KENALOG) 0.1 % lotion Apply topically 2 (two) times daily as needed.          BP 108/60  Temp(Src) 98.6 F (37 C) (Oral)  Wt 136 lb (61.689 kg)  LMP 10/27/2006chart Objective:   Physical Exam  Constitutional: She is oriented to person, place, and time. She appears well-developed and well-nourished.  HENT:  Right Ear: External ear normal.  Left Ear: External ear normal.  Nose: Nose normal.       Maxillary sinus tenderness to palpation.  Cardiovascular: Normal rate, regular rhythm and  normal heart sounds.   Pulmonary/Chest: Effort normal and breath sounds normal.  Musculoskeletal: Normal range of motion.  Neurological: She is alert and oriented to person, place, and time.  Skin: Skin is dry.  Psychiatric: She has a normal mood and affect.          Assessment & Plan:  Assessment: Acute sinusitis  Plan over-the-counter Claritin-D or Zyrtec-D twice daily. Augmentin 875 one by mouth twice a day x10 days rest. Drink plenty of fluids. Call the office if symptoms worsen or persist, recheck as scheduled and when necessary.

## 2011-06-04 NOTE — Patient Instructions (Signed)

## 2011-06-26 ENCOUNTER — Telehealth: Payer: Self-pay | Admitting: *Deleted

## 2011-06-26 NOTE — Telephone Encounter (Signed)
Per Dr. Clent Ridges, pt should have no side effects.  Ingredient is Diclofenac.  Grandmother notified.

## 2011-06-26 NOTE — Telephone Encounter (Signed)
Pt put a Flector Patch on grandson's injured shoulder (61 years old), and is worried it may have been too strong.  They took it off, and he has an appt with his Pediatrician today.  Any problems that Dr. Clent Ridges would be aware of?

## 2011-07-11 ENCOUNTER — Telehealth: Payer: Self-pay | Admitting: Family Medicine

## 2011-07-11 NOTE — Telephone Encounter (Signed)
Pt called and said that spouse has just lost his job and she is retired, so pt is losing their insurance by 07/26/11. Pt is req to be worked in for fasting cpx/pap if needed, prior to 07/26/11. Pls advise if ok to work in schedule.

## 2011-07-12 NOTE — Telephone Encounter (Signed)
Her cpx last year was on 07-24-10 and we can only do this once a year. I suggest we set her up for a cpx on 07-25-11

## 2011-07-12 NOTE — Telephone Encounter (Signed)
Pt called back and stated she will indeed take the appt to be worked in on 07/25/11.  Appt was scheduled for pt to see Dr. Clent Ridges at 11:00am on 2/27.

## 2011-07-12 NOTE — Telephone Encounter (Signed)
Pt states she called her insurance company and was told that she could have her cpx once every calender year and that she could have er cpx now.  Pt states she would like to come in before 07/25/2011.

## 2011-07-25 ENCOUNTER — Ambulatory Visit (INDEPENDENT_AMBULATORY_CARE_PROVIDER_SITE_OTHER): Payer: 59 | Admitting: Family Medicine

## 2011-07-25 ENCOUNTER — Encounter: Payer: Self-pay | Admitting: Family Medicine

## 2011-07-25 VITALS — BP 110/68 | HR 76 | Temp 98.3°F | Ht 60.75 in | Wt 137.0 lb

## 2011-07-25 DIAGNOSIS — M8580 Other specified disorders of bone density and structure, unspecified site: Secondary | ICD-10-CM

## 2011-07-25 DIAGNOSIS — M899 Disorder of bone, unspecified: Secondary | ICD-10-CM

## 2011-07-25 DIAGNOSIS — Z0000 Encounter for general adult medical examination without abnormal findings: Secondary | ICD-10-CM

## 2011-07-25 DIAGNOSIS — G500 Trigeminal neuralgia: Secondary | ICD-10-CM

## 2011-07-25 DIAGNOSIS — M949 Disorder of cartilage, unspecified: Secondary | ICD-10-CM

## 2011-07-25 LAB — POCT URINALYSIS DIPSTICK
Glucose, UA: NEGATIVE
Spec Grav, UA: 1.02
Urobilinogen, UA: 0.2

## 2011-07-25 LAB — TSH: TSH: 1.03 u[IU]/mL (ref 0.35–5.50)

## 2011-07-25 LAB — HEPATIC FUNCTION PANEL
Bilirubin, Direct: 0 mg/dL (ref 0.0–0.3)
Total Bilirubin: 0.6 mg/dL (ref 0.3–1.2)
Total Protein: 7.2 g/dL (ref 6.0–8.3)

## 2011-07-25 LAB — BASIC METABOLIC PANEL
CO2: 30 mEq/L (ref 19–32)
Calcium: 9.6 mg/dL (ref 8.4–10.5)
Creatinine, Ser: 0.7 mg/dL (ref 0.4–1.2)

## 2011-07-25 LAB — CBC WITH DIFFERENTIAL/PLATELET
Eosinophils Relative: 4.8 % (ref 0.0–5.0)
HCT: 42.8 % (ref 36.0–46.0)
Lymphocytes Relative: 31.4 % (ref 12.0–46.0)
Lymphs Abs: 1.7 10*3/uL (ref 0.7–4.0)
Monocytes Relative: 9.8 % (ref 3.0–12.0)
Neutrophils Relative %: 53.4 % (ref 43.0–77.0)
Platelets: 202 10*3/uL (ref 150.0–400.0)
WBC: 5.4 10*3/uL (ref 4.5–10.5)

## 2011-07-25 LAB — VITAMIN B12: Vitamin B-12: 548 pg/mL (ref 211–911)

## 2011-07-25 LAB — LIPID PANEL
HDL: 47.5 mg/dL (ref >39.00)
LDL Cholesterol: 99 mg/dL (ref 0–99)
Total CHOL/HDL Ratio: 4
Triglycerides: 111 mg/dL (ref 0.0–149.0)

## 2011-07-25 MED ORDER — GABAPENTIN 100 MG PO CAPS: 100.0000 mg | ORAL_CAPSULE | Freq: Three times a day (TID) | ORAL | Status: DC

## 2011-07-25 NOTE — Progress Notes (Signed)
  Subjective:    Patient ID: Brandy Boyle, female    DOB: 1950/10/03, 61 y.o.   MRN: [id number]  HPI 61 yr old female for a cpx. She feels well in general but she does mention some intermittent sensations that she gets on either side of the head at times. These can be sharp or burning sensations, or they may be numbness or tingling. No blurred vision or other neurologic deficits.    Review of Systems  Constitutional: Negative.   HENT: Negative.   Eyes: Negative.   Respiratory: Negative.   Cardiovascular: Negative.   Gastrointestinal: Negative.   Genitourinary: Negative for dysuria, urgency, frequency, hematuria, flank pain, decreased urine volume, enuresis, difficulty urinating, pelvic pain and dyspareunia.  Musculoskeletal: Negative.   Skin: Negative.   Neurological: Negative.   Hematological: Negative.   Psychiatric/Behavioral: Negative.        Objective:   Physical Exam  Constitutional: She is oriented to person, place, and time. She appears well-developed and well-nourished. No distress.  HENT:  Head: Normocephalic and atraumatic.  Right Ear: External ear normal.  Left Ear: External ear normal.  Nose: Nose normal.  Mouth/Throat: Oropharynx is clear and moist. No oropharyngeal exudate.  Eyes: Conjunctivae and EOM are normal. Pupils are equal, round, and reactive to light. No scleral icterus.  Neck: Normal range of motion. Neck supple. No JVD present. No thyromegaly present.  Cardiovascular: Normal rate, regular rhythm, normal heart sounds and intact distal pulses.  Exam reveals no gallop and no friction rub.   No murmur heard.      EKG normal   Pulmonary/Chest: Effort normal and breath sounds normal. No respiratory distress. She has no wheezes. She has no rales. She exhibits no tenderness.  Abdominal: Soft. Bowel sounds are normal. She exhibits no distension and no mass. There is no tenderness. There is no rebound and no guarding.  Musculoskeletal: Normal range of motion.  She exhibits no edema and no tenderness.  Lymphadenopathy:    She has no cervical adenopathy.  Neurological: She is alert and oriented to person, place, and time. She has normal reflexes. No cranial nerve deficit. She exhibits normal muscle tone. Coordination normal.  Skin: Skin is warm and dry. No rash noted. No erythema.  Psychiatric: She has a normal mood and affect. Her behavior is normal. Judgment and thought content normal.          Assessment & Plan:  Well exam. Get fasting labs. Set up another DEXA. Try Gabapentin for trigeminal neuralgia.

## 2011-07-30 ENCOUNTER — Ambulatory Visit (INDEPENDENT_AMBULATORY_CARE_PROVIDER_SITE_OTHER)
Admission: RE | Admit: 2011-07-30 | Discharge: 2011-07-30 | Disposition: A | Discharge status: Home / Self Care | Payer: 59 | Source: Ambulatory Visit

## 2011-07-30 DIAGNOSIS — M949 Disorder of cartilage, unspecified: Secondary | ICD-10-CM

## 2011-07-30 DIAGNOSIS — M899 Disorder of bone, unspecified: Secondary | ICD-10-CM

## 2011-07-30 DIAGNOSIS — M8580 Other specified disorders of bone density and structure, unspecified site: Secondary | ICD-10-CM

## 2011-07-31 NOTE — Progress Notes (Signed)
Quick Note:  Spoke with pt ______ 

## 2011-07-31 NOTE — Progress Notes (Signed)
Quick Note:  Left a message for pt to return call. ______ 

## 2011-08-27 ENCOUNTER — Telehealth: Payer: Self-pay | Admitting: Family Medicine

## 2011-08-27 ENCOUNTER — Encounter: Payer: Self-pay | Admitting: Family Medicine

## 2011-08-27 NOTE — Telephone Encounter (Signed)
I spoke with pt, don't have results yet. I will call back when they are in.

## 2011-08-27 NOTE — Telephone Encounter (Signed)
Patient called stating that she would like a call back with bone density test results. Please assist.

## 2011-09-04 ENCOUNTER — Telehealth: Payer: Self-pay | Admitting: Family Medicine

## 2011-09-04 DIAGNOSIS — M545 Low back pain: Secondary | ICD-10-CM

## 2011-09-04 DIAGNOSIS — M25551 Pain in right hip: Secondary | ICD-10-CM

## 2011-09-04 NOTE — Telephone Encounter (Signed)
Pt called req to get bone density results and also is req to get an orthopedic referral for back and hip pain. Pt said that she has discussed this pain with Dr Clent Ridges before. Pt said if pcp didn't want to do referral, then maybe he can recommend a med for anti-inflamatory med and write a script.

## 2011-09-04 NOTE — Progress Notes (Signed)
Quick Note:  Spoke with pt ______ 

## 2011-09-06 ENCOUNTER — Other Ambulatory Visit: Payer: Self-pay | Admitting: Family Medicine

## 2011-09-06 NOTE — Telephone Encounter (Signed)
I did the referral to Ortho. See the report for her DEXA

## 2011-09-07 NOTE — Telephone Encounter (Signed)
I had already gave results. I tried to reach pt to tell about referral but no answer or option to leave a message.

## 2011-09-07 NOTE — Telephone Encounter (Signed)
Call in #60 with 5 rf 

## 2011-09-11 ENCOUNTER — Telehealth: Payer: Self-pay | Admitting: Family Medicine

## 2011-09-11 MED ORDER — DICLOFENAC SODIUM 75 MG PO TBEC: 75.0000 mg | DELAYED_RELEASE_TABLET | Freq: Two times a day (BID) | ORAL | Status: DC

## 2011-09-11 NOTE — Telephone Encounter (Signed)
Call in Diclofenac 75 mg bid prn pain, #60 with 11 rf 

## 2011-09-11 NOTE — Telephone Encounter (Signed)
Script sent e-scribe 

## 2011-09-11 NOTE — Telephone Encounter (Signed)
Request for Diclofenac for hip & back pain.

## 2012-01-10 ENCOUNTER — Other Ambulatory Visit: Payer: Self-pay | Admitting: Family Medicine

## 2012-01-31 ENCOUNTER — Other Ambulatory Visit: Payer: Self-pay | Admitting: Family Medicine

## 2012-01-31 NOTE — Telephone Encounter (Signed)
Call in #60 with 5 rf 

## 2012-02-20 ENCOUNTER — Other Ambulatory Visit: Payer: Self-pay | Admitting: Family Medicine

## 2012-03-16 ENCOUNTER — Encounter (HOSPITAL_COMMUNITY): Payer: Self-pay | Admitting: Nurse Practitioner

## 2012-03-16 ENCOUNTER — Emergency Department (HOSPITAL_COMMUNITY)
Admission: EM | Admit: 2012-03-16 | Discharge: 2012-03-16 | Disposition: A | Discharge status: Home / Self Care | Payer: 59 | Attending: Emergency Medicine | Admitting: Emergency Medicine

## 2012-03-16 DIAGNOSIS — E039 Hypothyroidism, unspecified: Secondary | ICD-10-CM | POA: Insufficient documentation

## 2012-03-16 DIAGNOSIS — F411 Generalized anxiety disorder: Secondary | ICD-10-CM | POA: Insufficient documentation

## 2012-03-16 DIAGNOSIS — K219 Gastro-esophageal reflux disease without esophagitis: Secondary | ICD-10-CM | POA: Insufficient documentation

## 2012-03-16 DIAGNOSIS — T148XXA Other injury of unspecified body region, initial encounter: Secondary | ICD-10-CM

## 2012-03-16 DIAGNOSIS — S139XXA Sprain of joints and ligaments of unspecified parts of neck, initial encounter: Secondary | ICD-10-CM | POA: Insufficient documentation

## 2012-03-16 DIAGNOSIS — M899 Disorder of bone, unspecified: Secondary | ICD-10-CM | POA: Insufficient documentation

## 2012-03-16 DIAGNOSIS — Y9229 Other specified public building as the place of occurrence of the external cause: Secondary | ICD-10-CM | POA: Insufficient documentation

## 2012-03-16 DIAGNOSIS — E785 Hyperlipidemia, unspecified: Secondary | ICD-10-CM | POA: Insufficient documentation
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
## 2012-03-16 NOTE — ED Provider Notes (Signed)
Medical screening examination/treatment/procedure(s) were performed by non-physician practitioner and as supervising physician I was immediately available for consultation/collaboration.  Laurabelle Gorczyca, MD 03/16/12 2313 
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
## 2012-05-09 ENCOUNTER — Other Ambulatory Visit: Payer: Self-pay | Admitting: Family Medicine

## 2012-05-09 NOTE — Telephone Encounter (Signed)
Is this the correct dose? 

## 2012-06-29 ENCOUNTER — Other Ambulatory Visit: Payer: Self-pay | Admitting: Family Medicine

## 2012-07-01 NOTE — Telephone Encounter (Signed)
Call in #60 with 2 rf. She needs an OV

## 2012-07-05 ENCOUNTER — Other Ambulatory Visit: Payer: Self-pay | Admitting: Family Medicine

## 2012-07-08 ENCOUNTER — Other Ambulatory Visit: Payer: Self-pay | Admitting: Family Medicine

## 2012-07-30 ENCOUNTER — Ambulatory Visit (INDEPENDENT_AMBULATORY_CARE_PROVIDER_SITE_OTHER): Payer: Managed Care, Other (non HMO) | Admitting: Family Medicine

## 2012-07-30 ENCOUNTER — Encounter: Payer: Self-pay | Admitting: Family Medicine

## 2012-07-30 VITALS — BP 116/70 | HR 83 | Temp 98.2°F | Wt 130.0 lb

## 2012-07-30 DIAGNOSIS — M6740 Ganglion, unspecified site: Secondary | ICD-10-CM

## 2012-07-30 DIAGNOSIS — M67432 Ganglion, left wrist: Secondary | ICD-10-CM

## 2012-07-30 NOTE — Progress Notes (Signed)
  Subjective:    Patient ID: Brandy Boyle, female    DOB: Aug 30, 1950, 62 y.o.   MRN: [id number]  HPI Here for one week of a painful lump on the left wrist. This is getting more painful and it interferes with her ROM.    Review of Systems  Constitutional: Negative.   Musculoskeletal: Positive for joint swelling.       Objective:   Physical Exam  Constitutional: She appears well-developed and well-nourished.  Musculoskeletal:  Tender round mobile lump over the left radial wrist          Assessment & Plan:  Refer to Hand Surgery

## 2012-08-11 ENCOUNTER — Telehealth: Payer: Self-pay | Admitting: Family Medicine

## 2012-08-11 MED ORDER — LEVOTHYROXINE SODIUM 50 MCG PO TABS: 50.0000 ug | ORAL_TABLET | ORAL | Status: DC

## 2012-08-11 NOTE — Telephone Encounter (Signed)
I did send script in.

## 2012-08-11 NOTE — Telephone Encounter (Signed)
Refill request for Synythroid 50 mcg, 90 day supply and yes it is okay to switch manufactors, per Dry. Clent Ridges and send in refills.

## 2012-09-02 ENCOUNTER — Telehealth: Payer: Self-pay | Admitting: Family Medicine

## 2012-09-02 NOTE — Telephone Encounter (Signed)
Patient Information:  Caller Name: Sreya  Phone: [phone]  Patient: Brandy Boyle, Brandy Boyle  Gender: Female  DOB: [birth year]  Age: 62 Years  PCP: [facility])  Office Follow Up:  Does the office need to follow up with this patient?: No  Instructions For The Office: N/A  RN Note:  c/o sinus pain x 5-6 weeks; states her pressure has worsened.  Denies severe pain, wheezing, or severe cough.  Afebrile.  Per sinus pain protocol, emergent symptoms denied; advised and offered appt within 24 hours.  States needs to check her schedule and will call office to schedule appt.  krs/can  Symptoms  Reason For Call & Symptoms: cold symptoms x 5-6 weeks, with sinus drainage and pain  Reviewed Health History In EMR: Yes  Reviewed Medications In EMR: Yes  Reviewed Allergies In EMR: Yes  Reviewed Surgeries / Procedures: Yes  Date of Onset of Symptoms: 07/31/2012  Guideline(s) Used:  Sinus Pain and Congestion  Disposition Per Guideline:   See Today or Tomorrow in Office  Reason For Disposition Reached:   Sinus congestion (pressure, fullness) present > 10 days  Advice Given:  N/A  Patient Refused Recommendation:  Patient Will Follow Up With Office Later  Needs to check schedule and will call office for appt krs/can

## 2012-10-01 ENCOUNTER — Encounter: Payer: Self-pay | Admitting: Family Medicine

## 2012-10-08 ENCOUNTER — Other Ambulatory Visit: Payer: Self-pay | Admitting: Family Medicine

## 2012-10-15 ENCOUNTER — Other Ambulatory Visit: Payer: Self-pay | Admitting: Family Medicine

## 2012-10-22 ENCOUNTER — Encounter: Payer: Self-pay | Admitting: Family Medicine

## 2012-10-22 ENCOUNTER — Ambulatory Visit (INDEPENDENT_AMBULATORY_CARE_PROVIDER_SITE_OTHER): Payer: Managed Care, Other (non HMO) | Admitting: Family Medicine

## 2012-10-22 VITALS — BP 110/70 | HR 70 | Temp 97.9°F | Ht 60.5 in | Wt 127.0 lb

## 2012-10-22 DIAGNOSIS — Z0000 Encounter for general adult medical examination without abnormal findings: Secondary | ICD-10-CM

## 2012-10-22 LAB — CBC WITH DIFFERENTIAL/PLATELET
Basophils Relative: 1 % (ref 0.0–3.0)
Eosinophils Relative: 9.2 % — ABNORMAL HIGH (ref 0.0–5.0)
HCT: 43.8 % (ref 36.0–46.0)
Hemoglobin: 14.9 g/dL (ref 12.0–15.0)
Lymphs Abs: 1.7 10*3/uL (ref 0.7–4.0)
Monocytes Relative: 10.8 % (ref 3.0–12.0)
Platelets: 197 10*3/uL (ref 150.0–400.0)
RBC: 4.73 Mil/uL (ref 3.87–5.11)
WBC: 4.9 10*3/uL (ref 4.5–10.5)

## 2012-10-22 LAB — POCT URINALYSIS DIPSTICK
Bilirubin, UA: NEGATIVE
Glucose, UA: NEGATIVE
Nitrite, UA: NEGATIVE
Urobilinogen, UA: 0.2

## 2012-10-22 LAB — BASIC METABOLIC PANEL
BUN: 10 mg/dL (ref 6–23)
CO2: 29 mEq/L (ref 19–32)
Calcium: 9.5 mg/dL (ref 8.4–10.5)
Creatinine, Ser: 0.7 mg/dL (ref 0.4–1.2)
GFR: 91.62 mL/min (ref >60.00)
Glucose, Bld: 86 mg/dL (ref 70–99)

## 2012-10-22 LAB — LIPID PANEL
Cholesterol: 155 mg/dL (ref 0–200)
HDL: 47 mg/dL (ref >39.00)
Triglycerides: 86 mg/dL (ref 0.0–149.0)
VLDL: 17.2 mg/dL (ref 0.0–40.0)

## 2012-10-22 LAB — TSH: TSH: 0.49 u[IU]/mL (ref 0.35–5.50)

## 2012-10-22 LAB — HEPATIC FUNCTION PANEL
Albumin: 4 g/dL (ref 3.5–5.2)
Total Protein: 6.9 g/dL (ref 6.0–8.3)

## 2012-10-22 MED ORDER — CITALOPRAM HYDROBROMIDE 20 MG PO TABS: 20.0000 mg | ORAL_TABLET | Freq: Every day | ORAL | Status: DC

## 2012-10-22 MED ORDER — ATORVASTATIN CALCIUM 80 MG PO TABS: 40.0000 mg | ORAL_TABLET | ORAL | Status: DC

## 2012-10-22 MED ORDER — DICLOFENAC SODIUM 75 MG PO TBEC: 75.0000 mg | DELAYED_RELEASE_TABLET | Freq: Two times a day (BID) | ORAL | Status: DC

## 2012-10-22 NOTE — Progress Notes (Signed)
  Subjective:    Patient ID: Brandy Boyle, female    DOB: 1950-09-29, 62 y.o.   MRN: [id number]  HPI 62 yr old female for a cpx. She feels well and has no concerns. She stays busy taking care of her 79 year old twin granddaughters. Of note, her estranged husband recently filed for divorce after 13 years of separation. She is comfortable with this.    Review of Systems  Constitutional: Negative.   HENT: Negative.   Eyes: Negative.   Respiratory: Negative.   Cardiovascular: Negative.   Gastrointestinal: Negative.   Genitourinary: Negative for dysuria, urgency, frequency, hematuria, flank pain, decreased urine volume, enuresis, difficulty urinating, pelvic pain and dyspareunia.  Musculoskeletal: Negative.   Skin: Negative.   Neurological: Negative.   Psychiatric/Behavioral: Negative.        Objective:   Physical Exam  Constitutional: She is oriented to person, place, and time. She appears well-developed and well-nourished. No distress.  HENT:  Head: Normocephalic and atraumatic.  Right Ear: External ear normal.  Left Ear: External ear normal.  Nose: Nose normal.  Mouth/Throat: Oropharynx is clear and moist. No oropharyngeal exudate.  Eyes: Conjunctivae and EOM are normal. Pupils are equal, round, and reactive to light. No scleral icterus.  Neck: Normal range of motion. Neck supple. No JVD present. No thyromegaly present.  Cardiovascular: Normal rate, regular rhythm, normal heart sounds and intact distal pulses.  Exam reveals no gallop and no friction rub.   No murmur heard. EKG normal   Pulmonary/Chest: Effort normal and breath sounds normal. No respiratory distress. She has no wheezes. She has no rales. She exhibits no tenderness.  Abdominal: Soft. Bowel sounds are normal. She exhibits no distension and no mass. There is no tenderness. There is no rebound and no guarding.  Musculoskeletal: Normal range of motion. She exhibits no edema and no tenderness.  Lymphadenopathy:    She  has no cervical adenopathy.  Neurological: She is alert and oriented to person, place, and time. She has normal reflexes. No cranial nerve deficit. She exhibits normal muscle tone. Coordination normal.  Skin: Skin is warm and dry. No rash noted. No erythema.  Psychiatric: She has a normal mood and affect. Her behavior is normal. Judgment and thought content normal.          Assessment & Plan:  Well exam. Get fasting labs.

## 2012-10-24 NOTE — Progress Notes (Signed)
Quick Note:  I left voice message with results. ______ 

## 2012-12-07 ENCOUNTER — Other Ambulatory Visit: Payer: Self-pay | Admitting: Family Medicine

## 2012-12-09 NOTE — Telephone Encounter (Signed)
PCP NA  Can refill # [phone]   Dr Clent Ridges for  Further refills

## 2013-01-08 ENCOUNTER — Other Ambulatory Visit: Payer: Self-pay | Admitting: Family Medicine

## 2013-01-08 NOTE — Telephone Encounter (Signed)
Refill once 

## 2013-01-08 NOTE — Telephone Encounter (Signed)
Can we refill for 30 day supply? Pt was last here on 10/22/12.

## 2013-01-31 ENCOUNTER — Other Ambulatory Visit: Payer: Self-pay | Admitting: Family Medicine

## 2013-02-01 ENCOUNTER — Other Ambulatory Visit: Payer: Self-pay | Admitting: Family Medicine

## 2013-02-02 NOTE — Telephone Encounter (Signed)
Call in #60 with 5 rf 

## 2013-03-08 ENCOUNTER — Other Ambulatory Visit: Payer: Self-pay | Admitting: Family Medicine

## 2013-04-02 ENCOUNTER — Other Ambulatory Visit: Payer: Self-pay

## 2013-06-09 ENCOUNTER — Ambulatory Visit (INDEPENDENT_AMBULATORY_CARE_PROVIDER_SITE_OTHER): Payer: Managed Care, Other (non HMO) | Admitting: Family Medicine

## 2013-06-09 ENCOUNTER — Encounter: Payer: Self-pay | Admitting: Family Medicine

## 2013-06-09 VITALS — BP 110/74 | HR 84 | Temp 98.1°F | Wt 128.0 lb

## 2013-06-09 DIAGNOSIS — J309 Allergic rhinitis, unspecified: Secondary | ICD-10-CM

## 2013-06-09 MED ORDER — FLUTICASONE PROPIONATE 50 MCG/ACT NA SUSP: 2.0000 | Freq: Every day | NASAL | Status: DC

## 2013-06-09 NOTE — Progress Notes (Signed)
   Subjective:    Patient ID: Brandy Boyle, female    DOB: [birth year], 63 y.o.   MRN: [id number]  HPI Here for 6 months of nasal congestion. No sinus pain or ST or fever. Using Afrin which helps but only lasts for 12 hours.    Review of Systems  Constitutional: Negative.   HENT: Positive for postnasal drip and sinus pressure. Negative for congestion, facial swelling, nosebleeds, rhinorrhea and sore throat.   Eyes: Negative.   Respiratory: Negative.        Objective:   Physical Exam  Constitutional: She appears well-developed and well-nourished.  HENT:  Right Ear: External ear normal.  Left Ear: External ear normal.  Mouth/Throat: Oropharynx is clear and moist.  The nasal septum is deviated slightly to the right. Membranes are inflamed and swollen   Eyes: Conjunctivae are normal.  Lymphadenopathy:    She has no cervical adenopathy.          Assessment & Plan:  Stop the OTC sprays and start Flonase daily. Recheck prn

## 2013-07-11 ENCOUNTER — Other Ambulatory Visit: Payer: Self-pay | Admitting: Family Medicine

## 2013-07-13 ENCOUNTER — Telehealth: Payer: Self-pay

## 2013-07-13 NOTE — Telephone Encounter (Signed)
Called and left a message for pt to return call; per Dr. Sarajane Jews pt needs testing and contract for further refills on lorazepam.

## 2013-07-13 NOTE — Telephone Encounter (Signed)
Call in #60 only. She needs testing and a contract  

## 2013-07-13 NOTE — Telephone Encounter (Signed)
Pt aware and will come by prior to any refills

## 2013-08-19 ENCOUNTER — Other Ambulatory Visit: Payer: Self-pay | Admitting: Family Medicine

## 2013-10-23 ENCOUNTER — Encounter: Payer: Self-pay | Admitting: Family Medicine

## 2013-10-23 ENCOUNTER — Ambulatory Visit (INDEPENDENT_AMBULATORY_CARE_PROVIDER_SITE_OTHER): Payer: Managed Care, Other (non HMO) | Admitting: Family Medicine

## 2013-10-23 VITALS — BP 111/66 | HR 72 | Temp 98.7°F | Ht 60.5 in | Wt 126.0 lb

## 2013-10-23 DIAGNOSIS — Z0000 Encounter for general adult medical examination without abnormal findings: Secondary | ICD-10-CM

## 2013-10-23 LAB — POCT URINALYSIS DIPSTICK
Bilirubin, UA: NEGATIVE
Blood, UA: NEGATIVE
Glucose, UA: NEGATIVE
Ketones, UA: NEGATIVE
NITRITE UA: NEGATIVE
PH UA: 8.5
PROTEIN UA: NEGATIVE
Spec Grav, UA: 1.015
UROBILINOGEN UA: 0.2

## 2013-10-23 LAB — BASIC METABOLIC PANEL
BUN: 13 mg/dL (ref 6–23)
CHLORIDE: 104 meq/L (ref 96–112)
CO2: 30 meq/L (ref 19–32)
CREATININE: 0.7 mg/dL (ref 0.4–1.2)
Calcium: 9.5 mg/dL (ref 8.4–10.5)
GFR: 88.36 mL/min (ref >60.00)
GLUCOSE: 80 mg/dL (ref 70–99)
POTASSIUM: 4.7 meq/L (ref 3.5–5.1)
Sodium: 141 mEq/L (ref 135–145)

## 2013-10-23 LAB — HEPATIC FUNCTION PANEL
ALBUMIN: 4 g/dL (ref 3.5–5.2)
ALT: 19 U/L (ref 0–35)
AST: 22 U/L (ref 0–37)
Alkaline Phosphatase: 46 U/L (ref 39–117)
Bilirubin, Direct: 0.1 mg/dL (ref 0.0–0.3)
TOTAL PROTEIN: 6.7 g/dL (ref 6.0–8.3)
Total Bilirubin: 0.6 mg/dL (ref 0.2–1.2)

## 2013-10-23 LAB — CBC WITH DIFFERENTIAL/PLATELET
BASOS ABS: 0 10*3/uL (ref 0.0–0.1)
Basophils Relative: 0.6 % (ref 0.0–3.0)
EOS PCT: 12.9 % — AB (ref 0.0–5.0)
Eosinophils Absolute: 0.7 10*3/uL (ref 0.0–0.7)
HEMATOCRIT: 43 % (ref 36.0–46.0)
Hemoglobin: 14.3 g/dL (ref 12.0–15.0)
LYMPHS ABS: 1.6 10*3/uL (ref 0.7–4.0)
Lymphocytes Relative: 27.4 % (ref 12.0–46.0)
MCHC: 33.3 g/dL (ref 30.0–36.0)
MCV: 93.6 fl (ref 78.0–100.0)
Monocytes Absolute: 0.5 10*3/uL (ref 0.1–1.0)
Monocytes Relative: 8.2 % (ref 3.0–12.0)
Neutro Abs: 2.9 10*3/uL (ref 1.4–7.7)
Neutrophils Relative %: 50.9 % (ref 43.0–77.0)
PLATELETS: 230 10*3/uL (ref 150.0–400.0)
RBC: 4.6 Mil/uL (ref 3.87–5.11)
RDW: 13.8 % (ref 11.5–15.5)
WBC: 5.7 10*3/uL (ref 4.0–10.5)

## 2013-10-23 LAB — LIPID PANEL
CHOLESTEROL: 154 mg/dL (ref 0–200)
HDL: 50.7 mg/dL (ref >39.00)
LDL Cholesterol: 89 mg/dL (ref 0–99)
Total CHOL/HDL Ratio: 3
Triglycerides: 73 mg/dL (ref 0.0–149.0)
VLDL: 14.6 mg/dL (ref 0.0–40.0)

## 2013-10-23 LAB — TSH: TSH: 0.74 u[IU]/mL (ref 0.35–4.50)

## 2013-10-23 MED ORDER — CLONAZEPAM 1 MG PO TABS: 1.0000 mg | ORAL_TABLET | Freq: Three times a day (TID) | ORAL | Status: DC | PRN

## 2013-10-23 MED ORDER — MELOXICAM 15 MG PO TABS: 15.0000 mg | ORAL_TABLET | Freq: Every day | ORAL | Status: DC

## 2013-10-23 MED ORDER — CITALOPRAM HYDROBROMIDE 20 MG PO TABS: 20.0000 mg | ORAL_TABLET | Freq: Every day | ORAL | Status: DC

## 2013-10-23 MED ORDER — TEMAZEPAM 30 MG PO CAPS: 30.0000 mg | ORAL_CAPSULE | Freq: Every evening | ORAL | Status: DC | PRN

## 2013-10-23 MED ORDER — LEVOTHYROXINE SODIUM 50 MCG PO TABS: ORAL_TABLET | ORAL | Status: DC

## 2013-10-23 MED ORDER — FLUTICASONE PROPIONATE 50 MCG/ACT NA SUSP: 2.0000 | Freq: Every day | NASAL | Status: DC

## 2013-10-23 MED ORDER — ATORVASTATIN CALCIUM 80 MG PO TABS: 40.0000 mg | ORAL_TABLET | ORAL | Status: DC
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
## 2013-10-23 NOTE — Progress Notes (Signed)
Pre visit review using our clinic review tool, if applicable. No additional management support is needed unless otherwise documented below in the visit note. 

## 2013-11-04 ENCOUNTER — Encounter: Payer: Self-pay | Admitting: Family Medicine

## 2013-12-30 ENCOUNTER — Encounter: Payer: Self-pay | Admitting: Internal Medicine

## 2014-02-15 ENCOUNTER — Other Ambulatory Visit: Payer: Self-pay | Admitting: Family Medicine

## 2014-03-03 ENCOUNTER — Ambulatory Visit (AMBULATORY_SURGERY_CENTER): Payer: Self-pay | Admitting: *Deleted

## 2014-03-03 VITALS — Ht 61.0 in | Wt 126.0 lb

## 2014-03-03 DIAGNOSIS — Z1211 Encounter for screening for malignant neoplasm of colon: Secondary | ICD-10-CM

## 2014-03-03 NOTE — Progress Notes (Signed)
No allergies to eggs or soy. No problems with anesthesia.  Pt given Emmi instructions for colonoscopy  No oxygen use  No diet drug use  

## 2014-03-09 ENCOUNTER — Encounter: Payer: Self-pay | Admitting: Internal Medicine

## 2014-03-12 ENCOUNTER — Other Ambulatory Visit: Payer: Self-pay

## 2014-03-18 ENCOUNTER — Ambulatory Visit (AMBULATORY_SURGERY_CENTER): Payer: Managed Care, Other (non HMO) | Admitting: Internal Medicine

## 2014-03-18 ENCOUNTER — Encounter: Payer: Self-pay | Admitting: Internal Medicine

## 2014-03-18 VITALS — BP 154/79 | HR 72 | Temp 97.6°F | Resp 15 | Ht 61.0 in | Wt 126.0 lb

## 2014-03-18 DIAGNOSIS — Z1211 Encounter for screening for malignant neoplasm of colon: Secondary | ICD-10-CM

## 2014-03-18 DIAGNOSIS — K5730 Diverticulosis of large intestine without perforation or abscess without bleeding: Secondary | ICD-10-CM

## 2014-03-18 HISTORY — PX: COLONOSCOPY: SHX174

## 2014-03-18 MED ORDER — SODIUM CHLORIDE 0.9 % IV SOLN: 500.0000 mL | INTRAVENOUS | Status: DC

## 2014-03-18 NOTE — Op Note (Signed)
[place]  [address]. [place], [id number]   COLONOSCOPY PROCEDURE REPORT  PATIENT: Brandy Boyle, Brandy Boyle  MR#: [id number] BIRTHDATE: 1950/09/15 , 10  yrs. old GENDER: female ENDOSCOPIST: Gatha Mayer, MD, [facility] PROCEDURE DATE:  03/18/2014 PROCEDURE:   Colonoscopy, screening First Screening Colonoscopy - Avg.  risk and is 50 yrs.  old or older - No.  Prior Negative Screening - Now for repeat screening. 10 or more years since last screening  History of Adenoma - Now for follow-up colonoscopy & has been > or = to 3 yrs.  N/A  Polyps Removed Today? No.  Polyps Removed Today? No.  Recommend repeat exam, <10 yrs? Polyps Removed Today? No.  Recommend repeat exam, <10 yrs? No. ASA CLASS:   Class II INDICATIONS:average risk for colorectal cancer. MEDICATIONS: Propofol 300 mg IV and Monitored anesthesia care  DESCRIPTION OF PROCEDURE:   After the risks benefits and alternatives of the procedure were thoroughly explained, informed consent was obtained.  The digital rectal exam revealed no abnormalities of the rectum.   The [id number]  endoscope was introduced through the anus and advanced to the cecum, which was identified by both the appendix and ileocecal valve. No adverse events experienced.   The quality of the prep was excellent, using MiraLax  The instrument was then slowly withdrawn as the colon was fully examined.      COLON FINDINGS: There was diverticulosis noted in the sigmoid colon. The examination was otherwise normal.   Right colon retroflexion included.  Retroflexed views revealed no abnormalities. The time to cecum=4 minutes 22 seconds.  Withdrawal time=11 minutes 38 seconds. The scope was withdrawn and the procedure completed. COMPLICATIONS: There were no immediate complications.  ENDOSCOPIC IMPRESSION: 1.   Diverticulosis was noted in the sigmoid colon 2.   The examination was otherwise normal - excellent prep -  second screening  RECOMMENDATIONS: Repeat colonoscopy 10 years - 2025  eSigned:  Gatha Mayer, MD, [facility] 03/18/2014 11:39 AM   cc: The Patient

## 2014-03-18 NOTE — Patient Instructions (Addendum)
No polyps today. You do have a condition called diverticulosis - common and not usually a problem. Please read the handout provided.  I appreciate the opportunity to care for you. Gatha Mayer, MD, FACG  YOU HAD AN ENDOSCOPIC PROCEDURE TODAY AT [facility]: Refer to the procedure report that was given to you for any specific questions about what was found during the examination.  If the procedure report does not answer your questions, please call your gastroenterologist to clarify.  If you requested that your care partner not be given the details of your procedure findings, then the procedure report has been included in a sealed envelope for you to review at your convenience later.  YOU SHOULD EXPECT: Some feelings of bloating in the abdomen. Passage of more gas than usual.  Walking can help get rid of the air that was put into your GI tract during the procedure and reduce the bloating. If you had a lower endoscopy (such as a colonoscopy or flexible sigmoidoscopy) you may notice spotting of blood in your stool or on the toilet paper. If you underwent a bowel prep for your procedure, then you may not have a normal bowel movement for a few days.  DIET: Your first meal following the procedure should be a light meal and then it is ok to progress to your normal diet.  A half-sandwich or bowl of soup is an example of a good first meal.  Heavy or fried foods are harder to digest and may make you feel nauseous or bloated.  Likewise meals heavy in dairy and vegetables can cause extra gas to form and this can also increase the bloating.  Drink plenty of fluids but you should avoid alcoholic beverages for 24 hours.  ACTIVITY: Your care partner should take you home directly after the procedure.  You should plan to take it easy, moving slowly for the rest of the day.  You can resume normal activity the day after the procedure however you should NOT DRIVE or use heavy machinery for 24  hours (because of the sedation medicines used during the test).    SYMPTOMS TO REPORT IMMEDIATELY: A gastroenterologist can be reached at any hour.  During normal business hours, 8:30 AM to 5:00 PM Monday through Friday, call [phone].  After hours and on weekends, please call the GI answering service at [phone] who will take a message and have the physician on call contact you.   Following lower endoscopy (colonoscopy or flexible sigmoidoscopy):  Excessive amounts of blood in the stool  Significant tenderness or worsening of abdominal pains  Swelling of the abdomen that is new, acute  Fever of 100F or higher    FOLLOW UP: If any biopsies were taken you will be contacted by phone or by letter within the next 1-3 weeks.  Call your gastroenterologist if you have not heard about the biopsies in 3 weeks.  Our staff will call the home number listed on your records the next business day following your procedure to check on you and address any questions or concerns that you may have at that time regarding the information given to you following your procedure. This is a courtesy call and so if there is no answer at the home number and we have not heard from you through the emergency physician on call, we will assume that you have returned to your regular daily activities without incident.  SIGNATURES/CONFIDENTIALITY: You and/or your care partner have signed paperwork  which will be entered into your electronic medical record.  These signatures attest to the fact that that the information above on your After Visit Summary has been reviewed and is understood.  Full responsibility of the confidentiality of this discharge information lies with you and/or your care-partner.  INFORMATION ON DIVERTICULOSIS GIVEN TO YOU TODAY

## 2014-03-19 ENCOUNTER — Telehealth: Payer: Self-pay | Admitting: *Deleted

## 2014-03-19 NOTE — Telephone Encounter (Signed)
Number identifier, left message, follow-up  

## 2014-04-13 ENCOUNTER — Emergency Department (HOSPITAL_COMMUNITY)
Admission: EM | Admit: 2014-04-13 | Discharge: 2014-04-13 | Disposition: A | Discharge status: Home / Self Care | Payer: Managed Care, Other (non HMO) | Attending: Emergency Medicine | Admitting: Emergency Medicine

## 2014-04-13 ENCOUNTER — Encounter (HOSPITAL_COMMUNITY): Payer: Self-pay | Admitting: Emergency Medicine

## 2014-04-13 ENCOUNTER — Emergency Department (HOSPITAL_COMMUNITY): Payer: Managed Care, Other (non HMO)

## 2014-04-13 ENCOUNTER — Telehealth: Payer: Self-pay | Admitting: Family Medicine

## 2014-04-13 DIAGNOSIS — E039 Hypothyroidism, unspecified: Secondary | ICD-10-CM | POA: Insufficient documentation

## 2014-04-13 DIAGNOSIS — Z7951 Long term (current) use of inhaled steroids: Secondary | ICD-10-CM | POA: Insufficient documentation

## 2014-04-13 DIAGNOSIS — K219 Gastro-esophageal reflux disease without esophagitis: Secondary | ICD-10-CM | POA: Diagnosis not present

## 2014-04-13 DIAGNOSIS — F419 Anxiety disorder, unspecified: Secondary | ICD-10-CM | POA: Insufficient documentation

## 2014-04-13 DIAGNOSIS — F329 Major depressive disorder, single episode, unspecified: Secondary | ICD-10-CM | POA: Insufficient documentation

## 2014-04-13 DIAGNOSIS — R140 Abdominal distension (gaseous): Secondary | ICD-10-CM | POA: Diagnosis present

## 2014-04-13 DIAGNOSIS — Z79899 Other long term (current) drug therapy: Secondary | ICD-10-CM | POA: Diagnosis not present

## 2014-04-13 DIAGNOSIS — R1013 Epigastric pain: Secondary | ICD-10-CM | POA: Diagnosis not present

## 2014-04-13 DIAGNOSIS — Z791 Long term (current) use of non-steroidal anti-inflammatories (NSAID): Secondary | ICD-10-CM | POA: Insufficient documentation

## 2014-04-13 DIAGNOSIS — E785 Hyperlipidemia, unspecified: Secondary | ICD-10-CM | POA: Insufficient documentation

## 2014-04-13 DIAGNOSIS — Z8739 Personal history of other diseases of the musculoskeletal system and connective tissue: Secondary | ICD-10-CM | POA: Diagnosis not present

## 2014-04-13 LAB — CBC WITH DIFFERENTIAL/PLATELET
BASOS ABS: 0.1 10*3/uL (ref 0.0–0.1)
Basophils Relative: 1 % (ref 0–1)
Eosinophils Absolute: 0.1 10*3/uL (ref 0.0–0.7)
Eosinophils Relative: 1 % (ref 0–5)
HCT: 41.2 % (ref 36.0–46.0)
Hemoglobin: 14.3 g/dL (ref 12.0–15.0)
Lymphocytes Relative: 8 % — ABNORMAL LOW (ref 12–46)
Lymphs Abs: 1.1 10*3/uL (ref 0.7–4.0)
MCH: 31.1 pg (ref 26.0–34.0)
MCHC: 34.7 g/dL (ref 30.0–36.0)
MCV: 89.6 fL (ref 78.0–100.0)
Monocytes Absolute: 0.6 10*3/uL (ref 0.1–1.0)
Monocytes Relative: 5 % (ref 3–12)
NEUTROS PCT: 85 % — AB (ref 43–77)
Neutro Abs: 11.3 10*3/uL — ABNORMAL HIGH (ref 1.7–7.7)
PLATELETS: 227 10*3/uL (ref 150–400)
RBC: 4.6 MIL/uL (ref 3.87–5.11)
RDW: 13.2 % (ref 11.5–15.5)
WBC: 13.1 10*3/uL — AB (ref 4.0–10.5)

## 2014-04-13 LAB — URINALYSIS, ROUTINE W REFLEX MICROSCOPIC
Bilirubin Urine: NEGATIVE
GLUCOSE, UA: NEGATIVE mg/dL
Ketones, ur: >80 mg/dL — AB
Leukocytes, UA: NEGATIVE
Nitrite: NEGATIVE
PH: 8.5 — AB (ref 5.0–8.0)
Protein, ur: 30 mg/dL — AB
SPECIFIC GRAVITY, URINE: 1.02 (ref 1.005–1.030)
Urobilinogen, UA: 0.2 mg/dL (ref 0.0–1.0)

## 2014-04-13 LAB — COMPREHENSIVE METABOLIC PANEL
ALK PHOS: 64 U/L (ref 39–117)
ALT: 19 U/L (ref 0–35)
AST: 22 U/L (ref 0–37)
Albumin: 4.2 g/dL (ref 3.5–5.2)
Anion gap: 18 — ABNORMAL HIGH (ref 5–15)
BUN: 11 mg/dL (ref 6–23)
CALCIUM: 9.8 mg/dL (ref 8.4–10.5)
CO2: 24 meq/L (ref 19–32)
Chloride: 100 mEq/L (ref 96–112)
Creatinine, Ser: 0.75 mg/dL (ref 0.50–1.10)
GFR calc Af Amer: >90 mL/min (ref >90)
GFR, EST NON AFRICAN AMERICAN: 88 mL/min — AB (ref >90)
Glucose, Bld: 122 mg/dL — ABNORMAL HIGH (ref 70–99)
POTASSIUM: 4.1 meq/L (ref 3.7–5.3)
SODIUM: 142 meq/L (ref 137–147)
Total Bilirubin: 0.5 mg/dL (ref 0.3–1.2)
Total Protein: 7.1 g/dL (ref 6.0–8.3)

## 2014-04-13 LAB — URINE MICROSCOPIC-ADD ON

## 2014-04-13 LAB — LIPASE, BLOOD: Lipase: 27 U/L (ref 11–59)

## 2014-04-13 MED ORDER — PANTOPRAZOLE SODIUM 40 MG PO TBEC
40.0000 mg | DELAYED_RELEASE_TABLET | Freq: Once | ORAL | Status: AC
  Administered 2014-04-13: 40 mg via ORAL
  Filled 2014-04-13: qty 1

## 2014-04-13 MED ORDER — PANTOPRAZOLE SODIUM 20 MG PO TBEC: 20.0000 mg | DELAYED_RELEASE_TABLET | Freq: Every day | ORAL | Status: DC

## 2014-04-13 NOTE — Telephone Encounter (Signed)
We can evaluate this tomorrow

## 2014-04-13 NOTE — ED Notes (Signed)
Pt. Stated, I've had about 3 bouts of feeling bloated in my stomach and it gets worse and I end up with diarrhea.  I had a colonoscopy on Oct. 22, and it showed I have diverticulitis.

## 2014-04-13 NOTE — ED Provider Notes (Signed)
CSN: [id number]     Arrival date & time 04/13/14  1700 History   First MD Initiated Contact with Patient 04/13/14 1959     Chief Complaint  Patient presents with  . Bloated     (Consider location/radiation/quality/duration/timing/severity/associated sxs/prior Treatment) HPI The patient reports she has had episodes of severe abdominal pain that have come and gone. This makes about the third similar episode. It starts with an intense bloating sensation in her epigastrium. She reports she also starts to get a rumbling and gurgling sensation. This escalates and then gets intensely painful with spasmodic quality of discomfort. She reports that when it peaks she starts having vomiting. She has had several episodes of vomiting. She reports ultimately the vomit becomes foamy and white and yellow. The episodes and then new usually abate and resolve for some time. However her most recent episode now is about 4 days ago. The patient cannot tie it to any particular foods. She pushes had very little to eat today. There is been no associated fever. The patient has been taking NSAIDs for about the past year for arthritic quality of pain. She never has any blood in her emesis. She has had a lower endoscopy but not an upper. Past Medical History  Diagnosis Date  . Depression   . Hyperlipidemia   . GERD (gastroesophageal reflux disease)   . Anxiety   . Hypothyroidism   . Osteopenia     last DEXA 01-28-08  . Vitreous tug syndrome     sees Dr. Sherlynn Stalls   . Arthritis    Past Surgical History  Procedure Laterality Date  . Tonsillectomy  1958  . Colonoscopy  2005 and 2015   Family History  Problem Relation Age of Onset  . Alcohol abuse      Family Hx  . Arthritis      Family Hx  . Breast cancer      1st degree  relative <50  . Diabetes      1st degree relative  . Hypertension      Family History   . Stroke      Family Hx 1st degree <50  . Heart disease      Family Hx  . Cholecystitis  Sister     sister had  cholectectomy  . Colon cancer Paternal Grandmother 64   History  Substance Use Topics  . Smoking status: Never Smoker   . Smokeless tobacco: Never Used  . Alcohol Use: No   OB History    No data available     Review of Systems   10 Systems reviewed and are negative for acute change except as noted in the HPI.  Allergies  Review of patient's allergies indicates no known allergies.  Home Medications   Prior to Admission medications   Medication Sig Start Date End Date Taking? Authorizing Provider  atorvastatin (LIPITOR) 80 MG tablet Take 0.5 tablets (40 mg total) by mouth every morning. 10/23/13  Yes Laurey Morale, MD  citalopram (CELEXA) 20 MG tablet Take 1 tablet (20 mg total) by mouth at bedtime. 10/23/13  Yes Laurey Morale, MD  clonazePAM (KLONOPIN) 1 MG tablet Take 1 tablet (1 mg total) by mouth 3 (three) times daily as needed for anxiety. 10/23/13  Yes Laurey Morale, MD  fluticasone (FLONASE) 50 MCG/ACT nasal spray Place 2 sprays into both nostrils daily. 10/23/13  Yes Laurey Morale, MD  levothyroxine (SYNTHROID, LEVOTHROID) 50 MCG tablet TAKE ONE TABLET BY MOUTH ONCE DAILY.  10/23/13  Yes Laurey Morale, MD  Melatonin 10 MG TABS Take 10 mg by mouth at bedtime.   Yes Historical Provider, MD  Multiple Vitamins-Minerals (MULTIVITAMIN WITH MINERALS) tablet Take 1 tablet by mouth daily.   Yes Historical Provider, MD  meloxicam (MOBIC) 15 MG tablet Take 1 tablet (15 mg total) by mouth daily. 10/23/13   Laurey Morale, MD  pantoprazole (PROTONIX) 20 MG tablet Take 1 tablet (20 mg total) by mouth daily. 04/13/14   Charlesetta Shanks, MD  temazepam (RESTORIL) 30 MG capsule Take 1 capsule (30 mg total) by mouth at bedtime as needed for sleep. Patient not taking: Reported on 04/13/2014 10/23/13   Laurey Morale, MD   BP 106/89 mmHg  Pulse 69  Temp(Src) 98.4 F (36.9 C) (Oral)  Resp 13  SpO2 98%  LMP 03/23/2005 Physical Exam  Constitutional: She is oriented to person,  place, and time. She appears well-developed and well-nourished.  HENT:  Head: Normocephalic and atraumatic.  Eyes: EOM are normal. Pupils are equal, round, and reactive to light.  Neck: Neck supple.  Cardiovascular: Normal rate, regular rhythm, normal heart sounds and intact distal pulses.   Pulmonary/Chest: Effort normal and breath sounds normal.  Abdominal: Soft. Bowel sounds are normal. She exhibits no distension. There is tenderness (Mild to moderateepigastric pain to palpation. No guarding no rebound.).  Musculoskeletal: Normal range of motion. She exhibits no edema.  Neurological: She is alert and oriented to person, place, and time. She has normal strength. Coordination normal. GCS eye subscore is 4. GCS verbal subscore is 5. GCS motor subscore is 6.  Skin: Skin is warm, dry and intact.  Psychiatric: She has a normal mood and affect.    ED Course  Procedures (including critical care time) Labs Review Labs Reviewed  CBC WITH DIFFERENTIAL - Abnormal; Notable for the following:    WBC 13.1 (*)    Neutrophils Relative % 85 (*)    Neutro Abs 11.3 (*)    Lymphocytes Relative 8 (*)    All other components within normal limits  COMPREHENSIVE METABOLIC PANEL - Abnormal; Notable for the following:    Glucose, Bld 122 (*)    GFR calc non Af Amer 88 (*)    Anion gap 18 (*)    All other components within normal limits  URINALYSIS, ROUTINE W REFLEX MICROSCOPIC - Abnormal; Notable for the following:    APPearance CLOUDY (*)    pH 8.5 (*)    Hgb urine dipstick TRACE (*)    Ketones, ur >80 (*)    Protein, ur 30 (*)    All other components within normal limits  LIPASE, BLOOD  URINE MICROSCOPIC-ADD ON    Imaging Review US Abdomen Limited  04/13/2014   CLINICAL DATA:  Epigastric abdominal pain. Bloating and diarrhea. Prior colonoscopy 3 weeks ago.  EXAM: US ABDOMEN LIMITED - RIGHT UPPER QUADRANT  COMPARISON:  07/09/2007  FINDINGS: Gallbladder:  No gallstones or wall thickening  visualized. No sonographic Murphy sign noted.  Common bile duct:  Diameter: 3 mm  Liver:  No focal lesion identified. Within normal limits in parenchymal echogenicity.  IMPRESSION: 1. Normal hepatobiliary sonogram.   Electronically Signed   By: Sherryl Barters M.D.   On: 04/13/2014 22:00     EKG Interpretation None      MDM   Final diagnoses:  Epigastric pain   At this point time the ultrasound does not show any acute cholecystitis or stones. The considerations for gastritis or peptic ulcer  disease. The patient has been on NSAIDs chronically for years duration. She however does not have ongoing continuous pain nor emesis that has ever been bloody. At this point time she will discontinue NSAIDs and start daily Protonix. She does have follow-up with her family physician in the morning.    Charlesetta Shanks, MD 04/13/14 [phone]

## 2014-04-13 NOTE — Telephone Encounter (Signed)
Patient Information:  Caller Name: Saina  Phone: [phone]  Patient: Brandy, Boyle  Gender: Female  DOB: [birth year]  Age: 63 Years  PCP: Alysia Penna [facility])  Office Follow Up:  Does the office need to follow up with this patient?: No  Instructions For The Office: N/A   Symptoms  Reason For Call & Symptoms: Patient calling after speaking with  office and making appointment for 9:30 am tomorrow 11/18.  First episode of sudden onset of severe upper abdomen bloating about 3 weeks ago.  Second episode on Friday 11/13.  Third episode today - ate about 1/2 cup soup at 11 am and symptoms started about 11:30 am and pain was severe by noon.  Patient has been severe and constant in stomach area, started with gurgle then pain.  Is keeping grandaughters and can hardly pay attention to them.  Tried vinegar then tums resulting in feeling like buring volcano inside.  Now weak, shakey  Reviewed Health History In EMR: Yes  Reviewed Medications In EMR: Yes  Reviewed Allergies In EMR: Yes  Reviewed Surgeries / Procedures: Yes  Date of Onset of Symptoms: Unknown  Treatments Tried: Gas X, antiacids Today 11/18 vinegar then tums made pain worse.  Treatments Tried Worked: No  Guideline(s) Used:  Abdominal Pain - Female  Abdominal Pain - Upper  Disposition Per Guideline:   Go to ED Now  Reason For Disposition Reached:   Pain lasting > 10 minutes and over 62 years old  Advice Given:  Call Back If:  You become worse.  Patient Will Follow Care Advice:  YES

## 2014-04-13 NOTE — Discharge Instructions (Signed)

## 2014-04-14 ENCOUNTER — Encounter: Payer: Self-pay | Admitting: Family Medicine

## 2014-04-14 ENCOUNTER — Ambulatory Visit (INDEPENDENT_AMBULATORY_CARE_PROVIDER_SITE_OTHER): Payer: Managed Care, Other (non HMO) | Admitting: Family Medicine

## 2014-04-14 VITALS — BP 116/76 | HR 80 | Temp 98.5°F | Ht 61.0 in | Wt 124.0 lb

## 2014-04-14 DIAGNOSIS — R1013 Epigastric pain: Secondary | ICD-10-CM

## 2014-04-14 MED ORDER — OMEPRAZOLE 40 MG PO CPDR: 40.0000 mg | DELAYED_RELEASE_CAPSULE | Freq: Every day | ORAL | Status: DC

## 2014-04-14 NOTE — Progress Notes (Signed)
Pre visit review using our clinic review tool, if applicable. No additional management support is needed unless otherwise documented below in the visit note. 

## 2014-04-14 NOTE — Progress Notes (Signed)
   Subjective:    Patient ID: Brandy Boyle, female    DOB: Aug 01, 1950, 63 y.o.   MRN: [id number]  HPI Here to follow up on an ED visit last night for 4 days of intermittent epigastric pains. Her workup last night included a CBC showing a mildly elevated WBC at 13.1 and a normal abdominal US. It as felt she was having gastritis form chronic NSAID use so she was told to stop these and to start on Protonix. Today she feels much better. She is able to eat and drink fluids with no nausea or pain. Her BMs have been regular. She had been taking Mobic daily for neck and low back pain.    Review of Systems  Constitutional: Negative.   Gastrointestinal: Positive for nausea, vomiting and abdominal pain. Negative for diarrhea, constipation, blood in stool, abdominal distention and anal bleeding.  Genitourinary: Negative.        Objective:   Physical Exam  Constitutional: She appears well-developed and well-nourished. No distress.  Cardiovascular: Normal rate, regular rhythm, normal heart sounds and intact distal pulses.   Pulmonary/Chest: Effort normal and breath sounds normal.  Abdominal: Soft. Bowel sounds are normal. She exhibits no distension and no mass. There is no tenderness. There is no rebound and no guarding.          Assessment & Plan:  Probable duodenitis and GERD. Instead of Protonix we will start on Omeprazole 40 mg daily. She will avoid all NSAIDs and try ES Tylenol 1 or 2 twice daily prn pain. Recheck prn

## 2014-08-16 ENCOUNTER — Telehealth: Payer: Self-pay | Admitting: Family Medicine

## 2014-08-16 ENCOUNTER — Emergency Department (HOSPITAL_COMMUNITY)
Admission: EM | Admit: 2014-08-16 | Discharge: 2014-08-16 | Disposition: A | Discharge status: Home / Self Care | Payer: Managed Care, Other (non HMO) | Attending: Emergency Medicine | Admitting: Emergency Medicine

## 2014-08-16 ENCOUNTER — Other Ambulatory Visit: Payer: Self-pay

## 2014-08-16 ENCOUNTER — Encounter (HOSPITAL_COMMUNITY): Payer: Self-pay | Admitting: Emergency Medicine

## 2014-08-16 DIAGNOSIS — Z79899 Other long term (current) drug therapy: Secondary | ICD-10-CM | POA: Diagnosis not present

## 2014-08-16 DIAGNOSIS — R112 Nausea with vomiting, unspecified: Secondary | ICD-10-CM

## 2014-08-16 DIAGNOSIS — F329 Major depressive disorder, single episode, unspecified: Secondary | ICD-10-CM | POA: Diagnosis not present

## 2014-08-16 DIAGNOSIS — R1013 Epigastric pain: Secondary | ICD-10-CM | POA: Diagnosis present

## 2014-08-16 DIAGNOSIS — F419 Anxiety disorder, unspecified: Secondary | ICD-10-CM | POA: Insufficient documentation

## 2014-08-16 DIAGNOSIS — N390 Urinary tract infection, site not specified: Secondary | ICD-10-CM | POA: Insufficient documentation

## 2014-08-16 DIAGNOSIS — K219 Gastro-esophageal reflux disease without esophagitis: Secondary | ICD-10-CM | POA: Insufficient documentation

## 2014-08-16 DIAGNOSIS — R8271 Bacteriuria: Secondary | ICD-10-CM

## 2014-08-16 DIAGNOSIS — E039 Hypothyroidism, unspecified: Secondary | ICD-10-CM | POA: Diagnosis not present

## 2014-08-16 DIAGNOSIS — M1990 Unspecified osteoarthritis, unspecified site: Secondary | ICD-10-CM | POA: Diagnosis not present

## 2014-08-16 DIAGNOSIS — E785 Hyperlipidemia, unspecified: Secondary | ICD-10-CM | POA: Insufficient documentation

## 2014-08-16 DIAGNOSIS — R197 Diarrhea, unspecified: Secondary | ICD-10-CM | POA: Diagnosis not present

## 2014-08-16 LAB — COMPREHENSIVE METABOLIC PANEL
ALT: 20 U/L (ref 0–35)
AST: 27 U/L (ref 0–37)
Albumin: 4.6 g/dL (ref 3.5–5.2)
Alkaline Phosphatase: 59 U/L (ref 39–117)
Anion gap: 10 (ref 5–15)
BUN: 12 mg/dL (ref 6–23)
CHLORIDE: 104 mmol/L (ref 96–112)
CO2: 27 mmol/L (ref 19–32)
Calcium: 9.4 mg/dL (ref 8.4–10.5)
Creatinine, Ser: 0.71 mg/dL (ref 0.50–1.10)
GFR calc Af Amer: >90 mL/min (ref >90)
GFR calc non Af Amer: 90 mL/min — ABNORMAL LOW (ref >90)
GLUCOSE: 118 mg/dL — AB (ref 70–99)
POTASSIUM: 3.9 mmol/L (ref 3.5–5.1)
SODIUM: 141 mmol/L (ref 135–145)
TOTAL PROTEIN: 7.9 g/dL (ref 6.0–8.3)
Total Bilirubin: 0.6 mg/dL (ref 0.3–1.2)

## 2014-08-16 LAB — URINE MICROSCOPIC-ADD ON

## 2014-08-16 LAB — CBC WITH DIFFERENTIAL/PLATELET
BASOS PCT: 0 % (ref 0–1)
Basophils Absolute: 0 10*3/uL (ref 0.0–0.1)
EOS ABS: 0 10*3/uL (ref 0.0–0.7)
EOS PCT: 0 % (ref 0–5)
HCT: 42.6 % (ref 36.0–46.0)
Hemoglobin: 14.7 g/dL (ref 12.0–15.0)
LYMPHS ABS: 0.8 10*3/uL (ref 0.7–4.0)
Lymphocytes Relative: 9 % — ABNORMAL LOW (ref 12–46)
MCH: 31.7 pg (ref 26.0–34.0)
MCHC: 34.5 g/dL (ref 30.0–36.0)
MCV: 92 fL (ref 78.0–100.0)
MONOS PCT: 3 % (ref 3–12)
Monocytes Absolute: 0.3 10*3/uL (ref 0.1–1.0)
Neutro Abs: 8.4 10*3/uL — ABNORMAL HIGH (ref 1.7–7.7)
Neutrophils Relative %: 88 % — ABNORMAL HIGH (ref 43–77)
PLATELETS: 230 10*3/uL (ref 150–400)
RBC: 4.63 MIL/uL (ref 3.87–5.11)
RDW: 13.1 % (ref 11.5–15.5)
WBC: 9.5 10*3/uL (ref 4.0–10.5)

## 2014-08-16 LAB — URINALYSIS, ROUTINE W REFLEX MICROSCOPIC
BILIRUBIN URINE: NEGATIVE
Glucose, UA: NEGATIVE mg/dL
HGB URINE DIPSTICK: NEGATIVE
Ketones, ur: >80 mg/dL — AB
NITRITE: NEGATIVE
PH: 8.5 — AB (ref 5.0–8.0)
Protein, ur: 100 mg/dL — AB
Specific Gravity, Urine: 1.026 (ref 1.005–1.030)
Urobilinogen, UA: 0.2 mg/dL (ref 0.0–1.0)

## 2014-08-16 LAB — LIPASE, BLOOD: Lipase: 30 U/L (ref 11–59)

## 2014-08-16 MED ORDER — CEPHALEXIN 500 MG PO CAPS: 500.0000 mg | ORAL_CAPSULE | Freq: Two times a day (BID) | ORAL | Status: DC

## 2014-08-16 MED ORDER — ONDANSETRON HCL 4 MG PO TABS: 4.0000 mg | ORAL_TABLET | Freq: Four times a day (QID) | ORAL | Status: DC

## 2014-08-16 MED ORDER — DEXTROSE 5 % IV SOLN
1.0000 g | Freq: Once | INTRAVENOUS | Status: AC
  Administered 2014-08-16: 1 g via INTRAVENOUS
  Filled 2014-08-16: qty 10

## 2014-08-16 NOTE — ED Notes (Signed)
Bed: [facility] Expected date:  Expected time:  Means of arrival:  Comments: Ems- abd pain with IV

## 2014-08-16 NOTE — Telephone Encounter (Deleted)
Rx request for Ventolin HFA 90 mcg inhal- Inhale 2 puffs into the lungs as needed 18 gm   Pharm: [employer]

## 2014-08-16 NOTE — ED Notes (Signed)
Per EMS, Pt from home c/o abdominal pain and vomiting since this afternoon. Pt has hx of chronic abdominal pain. Pt sts that pain frequently starts around noon. Pt also c/o diarrhea. A&Ox4. Pt sts she has vomited about 8 times today. Pt given 4mg  zofran en route.

## 2014-08-16 NOTE — Telephone Encounter (Signed)
Patient's daughter states patient is having another stomach attack, patient is in severe pain and vomiting since 11 am today.  She would like to know what to do? Patient is taking Nexium and has gotten a little bit better.

## 2014-08-16 NOTE — Telephone Encounter (Signed)
Error

## 2014-08-16 NOTE — Telephone Encounter (Signed)
Pt is on her way right now to [place], I spoke with pt's daughter.

## 2014-08-16 NOTE — Telephone Encounter (Signed)
Left a message for pt to return call 

## 2014-08-16 NOTE — ED Provider Notes (Signed)
CSN: [id number]     Arrival date & time 08/16/14  1605 History   First MD Initiated Contact with Patient 08/16/14 1707     Chief Complaint  Patient presents with  . Abdominal Pain  . Emesis  . Diarrhea     Patient is a 64 y.o. female presenting with abdominal pain, vomiting, and diarrhea. The history is provided by the patient. No language interpreter was used.  Abdominal Pain Associated symptoms: diarrhea and vomiting   Emesis Associated symptoms: abdominal pain and diarrhea   Diarrhea Associated symptoms: abdominal pain and vomiting    [name] resents for evaluation of abdominal pain, vomiting, diarrhea. Her symptoms started around noon today. She developed epigastric abdominal pain and bloating followed by vomiting. She vomited approximately 8 times. It is nonbloody. She also had significant diarrhea, which was also nonbloody. She denies any fevers. She had associated cold sweats. She's had a few prior similar episodes in the past with no clear diagnosis. It is been a while since her last episode she has been on antacid, which she thinks decrease in number of episodes that she sound. Her symptoms have improved since they started. The Zofran that she received by EMS gave her significant relief. She denies any chest pain, shortness of breath, fevers.  Past Medical History  Diagnosis Date  . Depression   . Hyperlipidemia   . GERD (gastroesophageal reflux disease)   . Anxiety   . Hypothyroidism   . Osteopenia     last DEXA 01-28-08  . Vitreous tug syndrome     sees Dr. Sherlynn Stalls   . Arthritis    Past Surgical History  Procedure Laterality Date  . Tonsillectomy  1958  . Colonoscopy  2005 and 2015   Family History  Problem Relation Age of Onset  . Alcohol abuse      Family Hx  . Arthritis      Family Hx  . Breast cancer      1st degree  relative <50  . Diabetes      1st degree relative  . Hypertension      Family History   . Stroke      Family Hx 1st degree <50  .  Heart disease      Family Hx  . Cholecystitis Sister     sister had  cholectectomy  . Colon cancer Paternal Grandmother 60   History  Substance Use Topics  . Smoking status: Never Smoker   . Smokeless tobacco: Never Used  . Alcohol Use: No   OB History    No data available     Review of Systems  Gastrointestinal: Positive for vomiting, abdominal pain and diarrhea.  All other systems reviewed and are negative.     Allergies  Review of patient's allergies indicates no known allergies.  Home Medications   Prior to Admission medications   Medication Sig Start Date End Date Taking? Authorizing Provider  acetaminophen (TYLENOL) 500 MG tablet Take 1,000 mg by mouth every 6 (six) hours as needed for mild pain, moderate pain or headache.   Yes Historical Provider, MD  atorvastatin (LIPITOR) 80 MG tablet Take 0.5 tablets (40 mg total) by mouth every morning. 10/23/13  Yes Laurey Morale, MD  citalopram (CELEXA) 20 MG tablet Take 1 tablet (20 mg total) by mouth at bedtime. 10/23/13  Yes Laurey Morale, MD  clonazePAM (KLONOPIN) 1 MG tablet Take 1 tablet (1 mg total) by mouth 3 (three) times daily as needed for  anxiety. 10/23/13  Yes Laurey Morale, MD  fluticasone (FLONASE) 50 MCG/ACT nasal spray Place 2 sprays into both nostrils daily. 10/23/13  Yes Laurey Morale, MD  levothyroxine (SYNTHROID, LEVOTHROID) 50 MCG tablet TAKE ONE TABLET BY MOUTH ONCE DAILY. 10/23/13  Yes Laurey Morale, MD  [place] GUM Take 2 tablets by mouth daily.   Yes Historical Provider, MD  Multiple Vitamins-Minerals (MULTIVITAMIN WITH MINERALS) tablet Take 1 tablet by mouth daily.   Yes Historical Provider, MD  temazepam (RESTORIL) 30 MG capsule Take 1 capsule (30 mg total) by mouth at bedtime as needed for sleep. 10/23/13  Yes Laurey Morale, MD  omeprazole (PRILOSEC) 40 MG capsule Take 1 capsule (40 mg total) by mouth daily. 04/14/14   Laurey Morale, MD   BP 135/78 mmHg  Pulse 75  Temp(Src) 98.1 F (36.7 C) (Oral)  Resp  20  SpO2 98%  LMP 03/23/2005 Physical Exam  Constitutional: She is oriented to person, place, and time. She appears well-developed and well-nourished.  HENT:  Head: Normocephalic and atraumatic.  Cardiovascular: Normal rate and regular rhythm.   No murmur heard. Pulmonary/Chest: Effort normal and breath sounds normal. No respiratory distress.  Abdominal: Soft. There is no rebound and no guarding.  Mild epigastric tenderness  Musculoskeletal: She exhibits no edema or tenderness.  Neurological: She is alert and oriented to person, place, and time.  Skin: Skin is warm and dry.  Psychiatric: She has a normal mood and affect. Her behavior is normal.  Nursing note and vitals reviewed.   ED Course  Procedures (including critical care time) Labs Review Labs Reviewed  URINALYSIS, ROUTINE W REFLEX MICROSCOPIC - Abnormal; Notable for the following:    Color, Urine AMBER (*)    APPearance CLOUDY (*)    pH 8.5 (*)    Ketones, ur >80 (*)    Protein, ur 100 (*)    Leukocytes, UA TRACE (*)    All other components within normal limits  COMPREHENSIVE METABOLIC PANEL - Abnormal; Notable for the following:    Glucose, Bld 118 (*)    GFR calc non Af Amer 90 (*)    All other components within normal limits  CBC WITH DIFFERENTIAL/PLATELET - Abnormal; Notable for the following:    Neutrophils Relative % 88 (*)    Neutro Abs 8.4 (*)    Lymphocytes Relative 9 (*)    All other components within normal limits  URINE MICROSCOPIC-ADD ON - Abnormal; Notable for the following:    Bacteria, UA MANY (*)    All other components within normal limits  LIPASE, BLOOD  CBC WITH DIFFERENTIAL/PLATELET    Imaging Review No results found.   EKG Interpretation None      MDM   Final diagnoses:  Epigastric pain  Non-intractable vomiting with nausea, vomiting of unspecified type  Bacteriuria   History here for evaluation of abdominal pain, vomiting, diarrhea with similar episodes in the past. Reviewed  prior records, patient had a negative abdominal ultrasound in November - no evidence of gallstones at that time.  UA is concerning for UTI given degree of bacteria, treated with one dose of Rocephin and prescription for Keflex. Patient's pain is resolved on repeat evaluation in the department and she has no recurrent vomiting. Discussed with patient follow-up with gastroenterology for further evaluation. Return precautions were discussed.    Quintella Reichert, MD 08/17/14 [phone]

## 2014-08-16 NOTE — ED Notes (Signed)
labs redrawn at 2052/walked to lab by RN

## 2014-08-16 NOTE — Telephone Encounter (Signed)
I would stay on Nexium bid and add TUMS in between. See me tomorrow if not better

## 2014-08-16 NOTE — Discharge Instructions (Signed)
Abdominal Pain °Many things can cause abdominal pain. Usually, abdominal pain is not caused by a disease and will improve without treatment. It can often be observed and treated at home. Your health care provider will do a physical exam and possibly order blood tests and X-rays to help determine the seriousness of your pain. However, in many cases, more time must pass before a clear cause of the pain can be found. Before that point, your health care provider may not know if you need more testing or further treatment. °HOME CARE INSTRUCTIONS  °Monitor your abdominal pain for any changes. The following actions may help to alleviate any discomfort you are experiencing: °· Only take over-the-counter or prescription medicines as directed by your health care provider. °· Do not take laxatives unless directed to do so by your health care provider. °· Try a clear liquid diet (broth, tea, or water) as directed by your health care provider. Slowly move to a bland diet as tolerated. °SEEK MEDICAL CARE IF: °· You have unexplained abdominal pain. °· You have abdominal pain associated with nausea or diarrhea. °· You have pain when you urinate or have a bowel movement. °· You experience abdominal pain that wakes you in the night. °· You have abdominal pain that is worsened or improved by eating food. °· You have abdominal pain that is worsened with eating fatty foods. °· You have a fever. °SEEK IMMEDIATE MEDICAL CARE IF:  °· Your pain does not go away within 2 hours. °· You keep throwing up (vomiting). °· Your pain is felt only in portions of the abdomen, such as the right side or the left lower portion of the abdomen. °· You pass bloody or black tarry stools. °MAKE SURE YOU: °· Understand these instructions.   °· Will watch your condition.   °· Will get help right away if you are not doing well or get worse.   °Document Released: 02/21/2005 Document Revised: 05/19/2013 Document Reviewed: 01/21/2013 °ExitCare® Patient Information  ©2015 [employer]. This information is not intended to replace advice given to you by your health care provider. Make sure you discuss any questions you have with your health care provider. °Urinary Tract Infection °Urinary tract infections (UTIs) can develop anywhere along your urinary tract. Your urinary tract is your body's drainage system for removing wastes and extra water. Your urinary tract includes two kidneys, two ureters, a bladder, and a urethra. Your kidneys are a pair of bean-shaped organs. Each kidney is about the size of your fist. They are located below your ribs, one on each side of your spine. °CAUSES °Infections are caused by microbes, which are microscopic organisms, including fungi, viruses, and bacteria. These organisms are so small that they can only be seen through a microscope. Bacteria are the microbes that most commonly cause UTIs. °SYMPTOMS  °Symptoms of UTIs may vary by age and gender of the patient and by the location of the infection. Symptoms in young women typically include a frequent and intense urge to urinate and a painful, burning feeling in the bladder or urethra during urination. Older women and men are more likely to be tired, shaky, and weak and have muscle aches and abdominal pain. A fever may mean the infection is in your kidneys. Other symptoms of a kidney infection include pain in your back or sides below the ribs, nausea, and vomiting. °DIAGNOSIS °To diagnose a UTI, your caregiver will ask you about your symptoms. Your caregiver also will ask to provide a urine sample. The urine sample   will be tested for bacteria and white blood cells. White blood cells are made by your body to help fight infection. °TREATMENT  °Typically, UTIs can be treated with medication. Because most UTIs are caused by a bacterial infection, they usually can be treated with the use of antibiotics. The choice of antibiotic and length of treatment depend on your symptoms and the type of bacteria  causing your infection. °HOME CARE INSTRUCTIONS °· If you were prescribed antibiotics, take them exactly as your caregiver instructs you. Finish the medication even if you feel better after you have only taken some of the medication. °· Drink enough water and fluids to keep your urine clear or pale yellow. °· Avoid caffeine, tea, and carbonated beverages. They tend to irritate your bladder. °· Empty your bladder often. Avoid holding urine for long periods of time. °· Empty your bladder before and after sexual intercourse. °· After a bowel movement, women should cleanse from front to back. Use each tissue only once. °SEEK MEDICAL CARE IF:  °· You have back pain. °· You develop a fever. °· Your symptoms do not begin to resolve within 3 days. °SEEK IMMEDIATE MEDICAL CARE IF:  °· You have severe back pain or lower abdominal pain. °· You develop chills. °· You have nausea or vomiting. °· You have continued burning or discomfort with urination. °MAKE SURE YOU:  °· Understand these instructions. °· Will watch your condition. °· Will get help right away if you are not doing well or get worse. °Document Released: 02/21/2005 Document Revised: 11/13/2011 Document Reviewed: 06/22/2011 °ExitCare® Patient Information ©2015 [employer]. This information is not intended to replace advice given to you by your health care provider. Make sure you discuss any questions you have with your health care provider. ° °

## 2014-08-17 ENCOUNTER — Telehealth: Payer: Self-pay | Admitting: Internal Medicine

## 2014-08-17 NOTE — Telephone Encounter (Signed)
Patient with two visits to the ER for epigastric pain and vomiting.  She will come in and see Alonza Bogus, PA on 08/19/14 at 2:00

## 2014-08-19 ENCOUNTER — Ambulatory Visit (INDEPENDENT_AMBULATORY_CARE_PROVIDER_SITE_OTHER): Payer: Managed Care, Other (non HMO) | Admitting: Gastroenterology

## 2014-08-19 ENCOUNTER — Encounter: Payer: Self-pay | Admitting: Gastroenterology

## 2014-08-19 VITALS — BP 120/74 | HR 84 | Ht 59.5 in | Wt 124.4 lb

## 2014-08-19 DIAGNOSIS — R112 Nausea with vomiting, unspecified: Secondary | ICD-10-CM

## 2014-08-19 DIAGNOSIS — R1013 Epigastric pain: Secondary | ICD-10-CM | POA: Diagnosis not present

## 2014-08-19 NOTE — Progress Notes (Signed)
     08/19/2014 Brandy Boyle [id number] 16-Mar-1951   History of Present Illness:  This is a 64 year old female who is known to Dr. Carlean Purl for colonoscopies.  Most recent colonoscopy was 02/2014 at which time she had only diverticulosis.   She presents to our office today with complaints of episodic upper abdominal pain associated with bloating and nausea and vomiting.  She's had 3 or 4 major episodes of this since they began in October 2015.  Says that they come on and are very severe pain with a lot of vomiting and last about 7 hours before resolving.  She went to the ED on two occasions, most recently with the episode she had earlier this week.  In November she had an ultrasound that showed no gallbladder abnormalities.  CBC, CMP, and lipase have been unremarkable.  After the first couple episodes (October and November) she was treated with a PPI by her PCP for possible ulcer disease.  She thought it may have helped for a while, but then she developed further episodes despite the medication.   Current Medications, Allergies, Past Medical History, Past Surgical History, Family History and Social History were reviewed in [employer] record.   Physical Exam: BP 120/74 mmHg  Pulse 84  Ht 4' 11.5" (1.511 m)  Wt 124 lb 6 oz (56.416 kg)  BMI 24.71 kg/m2  LMP 03/23/2005 General: Well developed white female in no acute distress Head: Normocephalic and atraumatic Eyes:  Sclerae anicteric, conjunctiva pink  Ears: Normal auditory acuity Lungs: Clear throughout to auscultation Heart: Regular rate and rhythm Abdomen: Soft, non-distended.  Normal bowel sounds.  Mild upper abdominal TTP without R/R/G. Musculoskeletal: Symmetrical with no gross deformities  Extremities: No edema  Neurological: Alert oriented x 4, grossly non-focal Psychological:  Alert and cooperative. Normal mood and affect  Assessment and Recommendations: -Episodic upper abdominal pain associated with  nausea and vomiting:  Three or 4 episodes since October that typically resolve within 7 hours or so and then patient is asymptomatic between episodes.  Ultrasound gallbladder was normal.  Sounds biliary, however, so will check HIDA scan with CCK.  Does not sound typical of ulcer disease, etc, however, if HIDA is negative then may need EGD.  In the interim she will restart the prilosec that she has at home.  She also has some Zofran to take if she would have another episode while we are awaiting evaluation.

## 2014-08-19 NOTE — Patient Instructions (Signed)
You have been scheduled for a HIDA scan at [facility] (1st floor) on 11:30. Please arrive 15 minutes prior to your scheduled appointment at  08-23-14. Make certain not to have anything to eat or drink at least 6 hours prior to your test. Should this appointment date or time not work well for you, please call radiology scheduling at [phone].  _____________________________________________________________________ hepatobiliary (HIDA) scan is an imaging procedure used to diagnose problems in the liver, gallbladder and bile ducts. In the HIDA scan, a radioactive chemical or tracer is injected into a vein in your arm. The tracer is handled by the liver like bile. Bile is a fluid produced and excreted by your liver that helps your digestive system break down fats in the foods you eat. Bile is stored in your gallbladder and the gallbladder releases the bile when you eat a meal. A special nuclear medicine scanner (gamma camera) tracks the flow of the tracer from your liver into your gallbladder and small intestine.  During your HIDA scan  You'll be asked to change into a hospital gown before your HIDA scan begins. Your health care team will position you on a table, usually on your back. The radioactive tracer is then injected into a vein in your arm.The tracer travels through your bloodstream to your liver, where it's taken up by the bile-producing cells. The radioactive tracer travels with the bile from your liver into your gallbladder and through your bile ducts to your small intestine.You may feel some pressure while the radioactive tracer is injected into your vein. As you lie on the table, a special gamma camera is positioned over your abdomen taking pictures of the tracer as it moves through your body. The gamma camera takes pictures continually for about an hour. You'll need to keep still during the HIDA scan. This can become uncomfortable, but you may find that you can lessen the discomfort by  taking deep breaths and thinking about other things. Tell your health care team if you're uncomfortable. The radiologist will watch on a computer the progress of the radioactive tracer through your body. The HIDA scan may be stopped when the radioactive tracer is seen in the gallbladder and enters your small intestine. This typically takes about an hour. In some cases extra imaging will be performed if original images aren't satisfactory, if morphine is given to help visualize the gallbladder or if the medication CCK is given to look at the contraction of the gallbladder. This test typically takes 2 hours to complete. ________________________________________________________________________

## 2014-08-23 ENCOUNTER — Ambulatory Visit (HOSPITAL_COMMUNITY)
Admission: RE | Admit: 2014-08-23 | Discharge: 2014-08-23 | Disposition: A | Discharge status: Home / Self Care | Payer: Managed Care, Other (non HMO) | Source: Ambulatory Visit | Attending: Gastroenterology | Admitting: Gastroenterology

## 2014-08-23 DIAGNOSIS — R1013 Epigastric pain: Secondary | ICD-10-CM | POA: Diagnosis not present

## 2014-08-23 DIAGNOSIS — R112 Nausea with vomiting, unspecified: Secondary | ICD-10-CM | POA: Diagnosis not present

## 2014-08-23 MED ORDER — SINCALIDE 5 MCG IJ SOLR
INTRAMUSCULAR | Status: AC
  Administered 2014-08-23: 1.1 ug via INTRAVENOUS
  Filled 2014-08-23: qty 5

## 2014-08-23 MED ORDER — SINCALIDE 5 MCG IJ SOLR
0.0200 ug/kg | Freq: Once | INTRAMUSCULAR | Status: AC
  Administered 2014-08-23: 1.1 ug via INTRAVENOUS

## 2014-08-23 MED ORDER — TECHNETIUM TC 99M MEBROFENIN IV KIT
5.0000 | PACK | Freq: Once | INTRAVENOUS | Status: AC | PRN
  Administered 2014-08-23: 5 via INTRAVENOUS

## 2014-08-24 NOTE — Progress Notes (Signed)
Agree with Ms. Zehr's management.  Cuma Polyakov E. Lovena Kluck, MD, FACG  

## 2014-08-26 ENCOUNTER — Ambulatory Visit (AMBULATORY_SURGERY_CENTER): Payer: Self-pay

## 2014-08-26 VITALS — Ht 59.5 in | Wt 125.8 lb

## 2014-08-26 DIAGNOSIS — R1013 Epigastric pain: Secondary | ICD-10-CM

## 2014-08-26 NOTE — Progress Notes (Signed)
Per pt, no allergies to soy or egg products.Pt not taking any weight loss meds or using  O2 at home. 

## 2014-09-06 ENCOUNTER — Encounter: Payer: Self-pay | Admitting: Internal Medicine

## 2014-09-06 ENCOUNTER — Ambulatory Visit (AMBULATORY_SURGERY_CENTER): Payer: Managed Care, Other (non HMO) | Admitting: Internal Medicine

## 2014-09-06 VITALS — BP 112/59 | HR 66 | Temp 98.7°F | Resp 21 | Ht 59.5 in | Wt 125.0 lb

## 2014-09-06 DIAGNOSIS — B9681 Helicobacter pylori [H. pylori] as the cause of diseases classified elsewhere: Secondary | ICD-10-CM | POA: Diagnosis not present

## 2014-09-06 DIAGNOSIS — R1013 Epigastric pain: Secondary | ICD-10-CM | POA: Diagnosis present

## 2014-09-06 DIAGNOSIS — K2950 Unspecified chronic gastritis without bleeding: Secondary | ICD-10-CM | POA: Diagnosis not present

## 2014-09-06 DIAGNOSIS — K317 Polyp of stomach and duodenum: Secondary | ICD-10-CM

## 2014-09-06 HISTORY — PX: ESOPHAGOGASTRODUODENOSCOPY: SHX1529

## 2014-09-06 MED ORDER — SODIUM CHLORIDE 0.9 % IV SOLN: 500.0000 mL | INTRAVENOUS | Status: DC

## 2014-09-06 NOTE — Progress Notes (Signed)
Called to room to assist during endoscopic procedure.  Patient ID and intended procedure confirmed with present staff. Received instructions for my participation in the procedure from the performing physician.  

## 2014-09-06 NOTE — Patient Instructions (Addendum)
The stomach looks irritated "gastritis" - I took biopsies. I also removed a tiny stomach polyp.  We will call with results and plans.  I appreciate the opportunity to care for you. Gatha Mayer, MD, [facility]  Discharge instructions given. Biopsies taken. Resume previous medications. YOU HAD AN ENDOSCOPIC PROCEDURE TODAY AT [facility]:   Refer to the procedure report that was given to you for any specific questions about what was found during the examination.  If the procedure report does not answer your questions, please call your gastroenterologist to clarify.  If you requested that your care partner not be given the details of your procedure findings, then the procedure report has been included in a sealed envelope for you to review at your convenience later.  YOU SHOULD EXPECT: Some feelings of bloating in the abdomen. Passage of more gas than usual.  Walking can help get rid of the air that was put into your GI tract during the procedure and reduce the bloating. If you had a lower endoscopy (such as a colonoscopy or flexible sigmoidoscopy) you may notice spotting of blood in your stool or on the toilet paper. If you underwent a bowel prep for your procedure, you may not have a normal bowel movement for a few days.  Please Note:  You might notice some irritation and congestion in your nose or some drainage.  This is from the oxygen used during your procedure.  There is no need for concern and it should clear up in a day or so.  SYMPTOMS TO REPORT IMMEDIATELY:    Following upper endoscopy (EGD)  Vomiting of blood or coffee ground material  New chest pain or pain under the shoulder blades  Painful or persistently difficult swallowing  New shortness of breath  Fever of 100F or higher  Black, tarry-looking stools  For urgent or emergent issues, a gastroenterologist can be reached at any hour by calling [phone].   DIET: Your first meal following the  procedure should be a small meal and then it is ok to progress to your normal diet. Heavy or fried foods are harder to digest and may make you feel nauseous or bloated.  Likewise, meals heavy in dairy and vegetables can increase bloating.  Drink plenty of fluids but you should avoid alcoholic beverages for 24 hours.  ACTIVITY:  You should plan to take it easy for the rest of today and you should NOT DRIVE or use heavy machinery until tomorrow (because of the sedation medicines used during the test).    FOLLOW UP: Our staff will call the number listed on your records the next business day following your procedure to check on you and address any questions or concerns that you may have regarding the information given to you following your procedure. If we do not reach you, we will leave a message.  However, if you are feeling well and you are not experiencing any problems, there is no need to return our call.  We will assume that you have returned to your regular daily activities without incident.  If any biopsies were taken you will be contacted by phone or by letter within the next 1-3 weeks.  Please call us at [phone] if you have not heard about the biopsies in 3 weeks.    SIGNATURES/CONFIDENTIALITY: You and/or your care partner have signed paperwork which will be entered into your electronic medical record.  These signatures attest to the fact that that the information  above on your After Visit Summary has been reviewed and is understood.  Full responsibility of the confidentiality of this discharge information lies with you and/or your care-partner. 

## 2014-09-06 NOTE — Op Note (Signed)
[place]  [address] [place], [address]   ENDOSCOPY PROCEDURE REPORT  PATIENT: Ayanah, Snader  MR#: [id number] BIRTHDATE: December 21, 1950 , 63  yrs. old GENDER: female ENDOSCOPIST: Gatha Mayer, MD, [facility] PROCEDURE DATE:  09/06/2014 PROCEDURE:  EGD w/ biopsy ASA CLASS:     Class II INDICATIONS:  epigastric pain. MEDICATIONS: Propofol 200 mg IV and Monitored anesthesia care TOPICAL ANESTHETIC: none  DESCRIPTION OF PROCEDURE: After the risks benefits and alternatives of the procedure were thoroughly explained, informed consent was obtained.  The LB [id number] endoscope was introduced through the mouth and advanced to the second portion of the duodenum , Without limitations.  The instrument was slowly withdrawn as the mucosa was fully examined.    1) 2-3 mm gastric polyp - mid body, anterior aspect - removed with cold forceps in 2 bites and sent to pathology 2) diffuse proximal gastritis - fundus and body, proxinmal antrum - erythematous, granular and slightly friable mucosa - biopsies taken and sent to pathology 3) Otherwise normal EGD.  Retroflexed views revealed as previously described.     The scope was then withdrawn from the patient and the procedure completed.  COMPLICATIONS: There were no immediate complications.  ENDOSCOPIC IMPRESSION: 1) 2-3 mm gastric polyp - mid body, anterior aspect - removed with cold forceps in 2 bites and sent to pathology 2) diffuse proximal gastritis - fundus and body, proxinmal antrum - erythematous, granular and slightly friable mucosa - biopsies taken and sent to pathology 3) Otherwise normal EGD  RECOMMENDATIONS: Office will call with results/plans  - if bxs unhelpful could need trial of anti-spasmodic (acute epigastric/RUQ pain with bloating, nausea/vomiting - negative GB  w/u so far), could need additional cross-sectional imaging.   eSigned:  Gatha Mayer, MD, [facility] 09/06/2014 10:48 AM    CC: The  Patient

## 2014-09-06 NOTE — Progress Notes (Signed)
Awake and alert, pleased with MAC Report to rn

## 2014-09-07 ENCOUNTER — Telehealth: Payer: Self-pay

## 2014-09-07 NOTE — Telephone Encounter (Signed)
Left message on answering machine. 

## 2014-09-09 ENCOUNTER — Encounter: Payer: Self-pay | Admitting: Internal Medicine

## 2014-09-09 DIAGNOSIS — B9681 Helicobacter pylori [H. pylori] as the cause of diseases classified elsewhere: Secondary | ICD-10-CM

## 2014-09-09 DIAGNOSIS — K2970 Gastritis, unspecified, without bleeding: Secondary | ICD-10-CM

## 2014-09-09 HISTORY — DX: Helicobacter pylori (H. pylori) as the cause of diseases classified elsewhere: B96.81

## 2014-09-09 NOTE — Progress Notes (Signed)
Quick Note:  Please let her know she has H. Pylori gastritis - likely cause of her pain Treat with Pylera Rx See me in June/July ______

## 2014-09-10 ENCOUNTER — Other Ambulatory Visit: Payer: Self-pay

## 2014-09-10 ENCOUNTER — Telehealth: Payer: Self-pay | Admitting: Internal Medicine

## 2014-09-10 MED ORDER — BIS SUBCIT-METRONID-TETRACYC 140-125-125 MG PO CAPS: 3.0000 | ORAL_CAPSULE | Freq: Three times a day (TID) | ORAL | Status: DC

## 2014-09-10 NOTE — Telephone Encounter (Signed)
Patient is advised it is ok to wait until [facility] can get the medication it is not an emergency.  She will call back for any additional questions or concerns

## 2014-09-10 NOTE — Telephone Encounter (Signed)
Pylera not going to be available until Monday.  [facility] pharmacy checked with other [facility] and they did not have it.  Wonders if someone else would carry it.  She is not sure what to do

## 2014-09-20 ENCOUNTER — Telehealth: Payer: Self-pay | Admitting: Internal Medicine

## 2014-09-20 NOTE — Telephone Encounter (Signed)
Patient has not been taking prilosec with her Pylera.  She has been having some stomach burning.  She is advised to take the PPI BID while taking pylera.  She will call back for any additional questions or concerns.

## 2014-09-22 ENCOUNTER — Telehealth: Payer: Self-pay | Admitting: Internal Medicine

## 2014-09-22 NOTE — Telephone Encounter (Signed)
I agree with RN advice to see PCP

## 2014-09-22 NOTE — Telephone Encounter (Signed)
Patient c/o 2 month history of left back pain.  She reports that the pain is now on her flank also.  She reports that the pain is worse in the morning after she gets out of bed and with position changes.  An ice pack improves her pain.  She is advised that this sounds like a neurological or a musculoskeletal pain since her symptoms are exacerbated with movement.  She is on her last day of Pylera and she reports that her epigastric pain and symptoms have improved.  She is concerned that her back pain could be pancreatic cancer, because of something she read on the Internet and her cousin has pancreatic cancer. She is advised that unlikely to be a pancreatic cancer. She had a recent limited US, labs, HIDA, EGD, and recent exam by GI.   She denies epigastric pain, nausea, vomiting.  She in encouraged to see her primary care about this back and flank pain. She will call back for any additional questions or concerns.

## 2014-09-27 ENCOUNTER — Telehealth: Payer: Self-pay | Admitting: Internal Medicine

## 2014-09-27 DIAGNOSIS — R1013 Epigastric pain: Secondary | ICD-10-CM

## 2014-09-27 DIAGNOSIS — R112 Nausea with vomiting, unspecified: Secondary | ICD-10-CM

## 2014-09-27 DIAGNOSIS — R140 Abdominal distension (gaseous): Secondary | ICD-10-CM

## 2014-09-27 NOTE — Telephone Encounter (Signed)
Patient had an episode of vomiting that started last night around 10 pm.  She reports that she had vomiting and bloating.  She said the symptoms resolved around 5 am this am.  She completed the pylera last week.  She would like to know what the next step?

## 2014-09-27 NOTE — Telephone Encounter (Signed)
Please arrange for CT abd/pelvis with oral and IV contrast re:  Recurrent epigastric pain, abdominal distention, nausea and vomiting - EGD and [place] and HIDA are unrevealing

## 2014-09-27 NOTE — Telephone Encounter (Signed)
Patient is scheduled for a CT for 09/30/14 2:00 at [facility].   Patient notified of recommendations and CT appt.  She verbalized understanding to pick up the contrast and instructions prior to Thursday, be NPO for 4 hours prior with the exception of contrast and to arrive at 1:45.

## 2014-09-30 ENCOUNTER — Ambulatory Visit (INDEPENDENT_AMBULATORY_CARE_PROVIDER_SITE_OTHER)
Admission: RE | Admit: 2014-09-30 | Discharge: 2014-09-30 | Disposition: A | Discharge status: Home / Self Care | Payer: Managed Care, Other (non HMO) | Source: Ambulatory Visit | Attending: Internal Medicine | Admitting: Internal Medicine

## 2014-09-30 DIAGNOSIS — R112 Nausea with vomiting, unspecified: Secondary | ICD-10-CM | POA: Diagnosis not present

## 2014-09-30 DIAGNOSIS — R140 Abdominal distension (gaseous): Secondary | ICD-10-CM

## 2014-09-30 DIAGNOSIS — R1013 Epigastric pain: Secondary | ICD-10-CM

## 2014-09-30 MED ORDER — IOHEXOL 300 MG/ML  SOLN
100.0000 mL | Freq: Once | INTRAMUSCULAR | Status: AC | PRN
  Administered 2014-09-30: 100 mL via INTRAVENOUS

## 2014-10-01 ENCOUNTER — Telehealth: Payer: Self-pay | Admitting: Internal Medicine

## 2014-10-01 NOTE — Telephone Encounter (Signed)
Dr. Carlean Purl patient asking for CT results please review and advise

## 2014-10-01 NOTE — Telephone Encounter (Signed)
No significant abnormalities I am not sure what other tests to do right now I want to review and recontact her assuming sxs same

## 2014-10-01 NOTE — Telephone Encounter (Signed)
Spoke with patient, she reports sx's are the same.  Her last episode was 09/26/14 and they last 7 hours.  She reports finishing her H. Pylori rx's 09/29/14.  She is fine in between episodes and she is on a bland diet.

## 2014-10-13 ENCOUNTER — Encounter: Payer: Self-pay | Admitting: Internal Medicine

## 2014-10-13 ENCOUNTER — Other Ambulatory Visit: Payer: Self-pay | Admitting: Internal Medicine

## 2014-10-13 DIAGNOSIS — R1115 Cyclical vomiting syndrome unrelated to migraine: Secondary | ICD-10-CM | POA: Insufficient documentation

## 2014-10-13 DIAGNOSIS — G43D0 Abdominal migraine, not intractable: Secondary | ICD-10-CM | POA: Insufficient documentation

## 2014-10-13 HISTORY — DX: Cyclical vomiting syndrome unrelated to migraine: R11.15

## 2014-10-13 NOTE — Progress Notes (Signed)
Quick Note:  I called her and reviewed her situation She does have a hx of migraines in past and they are in her family So I think abdominal migraine/cyclic vomiting syndrome possible - she will read about this  Also has uterine mass - probably unrelated but needs eval  1) Referral to Dr. Uvaldo Rising re: uterine mass on CT 2) Prochlorperazine suppository 25 mg 1 every 12 hrs prn nausea/vomiting #12 1 RF 3) Hydrocodone 5 mg APAP 400 mg 1-2 every 4 hrs prn pain # 30 no RF we can leave up front 4) Keep 6/28 appt call back sooner prn ______

## 2014-10-14 ENCOUNTER — Other Ambulatory Visit: Payer: Self-pay

## 2014-10-14 DIAGNOSIS — N858 Other specified noninflammatory disorders of uterus: Secondary | ICD-10-CM

## 2014-10-14 MED ORDER — PROCHLORPERAZINE 25 MG RE SUPP: 25.0000 mg | Freq: Two times a day (BID) | RECTAL | Status: DC | PRN

## 2014-10-14 MED ORDER — HYDROCODONE-ACETAMINOPHEN 5-400 MG PO TABS: ORAL_TABLET | ORAL | Status: DC

## 2014-10-22 ENCOUNTER — Telehealth: Payer: Self-pay | Admitting: Internal Medicine

## 2014-10-22 MED ORDER — HYDROCODONE-ACETAMINOPHEN 5-325 MG PO TABS: ORAL_TABLET | ORAL | Status: DC

## 2014-10-22 NOTE — Telephone Encounter (Signed)
Verbally gave Vicodin 5/325 rx to [facility] since [facility] rx has been discontinued.  Per Barb Merino, RN, [facility] ok to switch rx's as Dr Carlean Purl is out of the office.  I was unable to reach patient so left her detailed voice mail that rx is ready for pick up and that we had to change it.

## 2014-10-26 ENCOUNTER — Other Ambulatory Visit (INDEPENDENT_AMBULATORY_CARE_PROVIDER_SITE_OTHER): Payer: Managed Care, Other (non HMO)

## 2014-10-26 ENCOUNTER — Encounter: Payer: Managed Care, Other (non HMO) | Admitting: Family Medicine

## 2014-10-26 DIAGNOSIS — Z0000 Encounter for general adult medical examination without abnormal findings: Secondary | ICD-10-CM

## 2014-10-26 LAB — TSH: TSH: 2.46 u[IU]/mL (ref 0.35–4.50)

## 2014-10-26 LAB — CBC WITH DIFFERENTIAL/PLATELET
BASOS PCT: 1 % (ref 0.0–3.0)
Basophils Absolute: 0 10*3/uL (ref 0.0–0.1)
EOS ABS: 0.3 10*3/uL (ref 0.0–0.7)
Eosinophils Relative: 8.3 % — ABNORMAL HIGH (ref 0.0–5.0)
HEMATOCRIT: 41.4 % (ref 36.0–46.0)
Hemoglobin: 14.2 g/dL (ref 12.0–15.0)
LYMPHS ABS: 1.8 10*3/uL (ref 0.7–4.0)
Lymphocytes Relative: 43.3 % (ref 12.0–46.0)
MCHC: 34.3 g/dL (ref 30.0–36.0)
MCV: 91.5 fl (ref 78.0–100.0)
MONO ABS: 0.4 10*3/uL (ref 0.1–1.0)
Monocytes Relative: 10.6 % (ref 3.0–12.0)
Neutro Abs: 1.5 10*3/uL (ref 1.4–7.7)
Neutrophils Relative %: 36.8 % — ABNORMAL LOW (ref 43.0–77.0)
Platelets: 201 10*3/uL (ref 150.0–400.0)
RBC: 4.52 Mil/uL (ref 3.87–5.11)
RDW: 13.8 % (ref 11.5–15.5)
WBC: 4.2 10*3/uL (ref 4.0–10.5)

## 2014-10-26 LAB — POCT URINALYSIS DIPSTICK
Bilirubin, UA: NEGATIVE
Blood, UA: NEGATIVE
GLUCOSE UA: NEGATIVE
Ketones, UA: NEGATIVE
Nitrite, UA: NEGATIVE
PH UA: 7.5
Protein, UA: NEGATIVE
Spec Grav, UA: 1.015
UROBILINOGEN UA: 0.2

## 2014-10-26 LAB — LIPID PANEL
CHOLESTEROL: 269 mg/dL — AB (ref 0–200)
HDL: 39.7 mg/dL (ref >39.00)
LDL Cholesterol: 196 mg/dL — ABNORMAL HIGH (ref 0–99)
NonHDL: 229.3
Total CHOL/HDL Ratio: 7
Triglycerides: 167 mg/dL — ABNORMAL HIGH (ref 0.0–149.0)
VLDL: 33.4 mg/dL (ref 0.0–40.0)

## 2014-10-26 LAB — COMPREHENSIVE METABOLIC PANEL
ALBUMIN: 4.1 g/dL (ref 3.5–5.2)
ALT: 33 U/L (ref 0–35)
AST: 27 U/L (ref 0–37)
Alkaline Phosphatase: 53 U/L (ref 39–117)
BUN: 9 mg/dL (ref 6–23)
CALCIUM: 9.5 mg/dL (ref 8.4–10.5)
CO2: 31 meq/L (ref 19–32)
Chloride: 103 mEq/L (ref 96–112)
Creatinine, Ser: 0.76 mg/dL (ref 0.40–1.20)
GFR: 81.43 mL/min (ref >60.00)
GLUCOSE: 89 mg/dL (ref 70–99)
POTASSIUM: 4.4 meq/L (ref 3.5–5.1)
Sodium: 138 mEq/L (ref 135–145)
Total Bilirubin: 0.6 mg/dL (ref 0.2–1.2)
Total Protein: 6.8 g/dL (ref 6.0–8.3)

## 2014-11-02 ENCOUNTER — Encounter: Payer: Self-pay | Admitting: Family Medicine

## 2014-11-02 ENCOUNTER — Ambulatory Visit (INDEPENDENT_AMBULATORY_CARE_PROVIDER_SITE_OTHER): Payer: Managed Care, Other (non HMO) | Admitting: Family Medicine

## 2014-11-02 VITALS — BP 112/74 | HR 83 | Temp 98.3°F | Ht 60.0 in | Wt 127.5 lb

## 2014-11-02 DIAGNOSIS — R112 Nausea with vomiting, unspecified: Secondary | ICD-10-CM | POA: Diagnosis not present

## 2014-11-02 DIAGNOSIS — Z0000 Encounter for general adult medical examination without abnormal findings: Secondary | ICD-10-CM

## 2014-11-02 MED ORDER — TEMAZEPAM 30 MG PO CAPS: 30.0000 mg | ORAL_CAPSULE | Freq: Every evening | ORAL | Status: DC | PRN

## 2014-11-02 MED ORDER — CITALOPRAM HYDROBROMIDE 20 MG PO TABS: 20.0000 mg | ORAL_TABLET | Freq: Every day | ORAL | Status: DC

## 2014-11-02 MED ORDER — ROSUVASTATIN CALCIUM 10 MG PO TABS: 10.0000 mg | ORAL_TABLET | Freq: Every day | ORAL | Status: DC

## 2014-11-02 MED ORDER — LEVOTHYROXINE SODIUM 50 MCG PO TABS: ORAL_TABLET | ORAL | Status: DC

## 2014-11-02 MED ORDER — CLONAZEPAM 1 MG PO TABS: 1.0000 mg | ORAL_TABLET | Freq: Three times a day (TID) | ORAL | Status: DC | PRN

## 2014-11-02 NOTE — Progress Notes (Signed)
   Subjective:    Patient ID: Brandy Boyle, female    DOB: 08/12/1950, 64 y.o.   MRN: [id number]  HPI 64 yr old female for a cpx. She feels well except for ongoing GI problems. She has frequent spells of epigastirc pains, often with nausea and vomiting. These can last from 1 to 3 hours before they go away. No diarrhea, in fact her BMs are fairly regular. No fever. No weight loss. She has had a full workup with Dr. Carlean Purl including labs, upper and lower endoscopy, and an abdominal and pelvic CT scan. None of this was especially revealing. She did have an H pylori gastritis that was treated, and she remains on chronic PPI therapy. It has been suggested that she could have cyclical vomiting syndrome, and I think this is likely. Dr. Carlean Purl gave her some Compazine suppositories to use prn, and these been the most effective medication she has tried for the nausea spells. She wants to stop Lipitor because it makes her joints ache.    Review of Systems  Constitutional: Negative.   HENT: Negative.   Eyes: Negative.   Respiratory: Negative.   Cardiovascular: Negative.   Gastrointestinal: Negative.   Genitourinary: Negative for dysuria, urgency, frequency, hematuria, flank pain, decreased urine volume, enuresis, difficulty urinating, pelvic pain and dyspareunia.  Musculoskeletal: Negative.   Skin: Negative.   Neurological: Negative.   Psychiatric/Behavioral: Negative.        Objective:   Physical Exam  Constitutional: She is oriented to person, place, and time. She appears well-developed and well-nourished. No distress.  HENT:  Head: Normocephalic and atraumatic.  Right Ear: External ear normal.  Left Ear: External ear normal.  Nose: Nose normal.  Mouth/Throat: Oropharynx is clear and moist. No oropharyngeal exudate.  Eyes: Conjunctivae and EOM are normal. Pupils are equal, round, and reactive to light. No scleral icterus.  Neck: Normal range of motion. Neck supple. No JVD present. No  thyromegaly present.  Cardiovascular: Normal rate, regular rhythm, normal heart sounds and intact distal pulses.  Exam reveals no gallop and no friction rub.   No murmur heard. Pulmonary/Chest: Effort normal and breath sounds normal. No respiratory distress. She has no wheezes. She has no rales. She exhibits no tenderness.  Abdominal: Soft. Bowel sounds are normal. She exhibits no distension and no mass. There is no tenderness. There is no rebound and no guarding.  Musculoskeletal: Normal range of motion. She exhibits no edema or tenderness.  Lymphadenopathy:    She has no cervical adenopathy.  Neurological: She is alert and oriented to person, place, and time. She has normal reflexes. No cranial nerve deficit. She exhibits normal muscle tone. Coordination normal.  Skin: Skin is warm and dry. No rash noted. No erythema.  Psychiatric: She has a normal mood and affect. Her behavior is normal. Judgment and thought content normal.          Assessment & Plan:  Well exam. I think she has cyclical vomiting syndrome, and she will use Compazine prn. We will switch from Lipitor to Crestor 10 mg daily. Her anxiety and depression is stable, so Celexa is refilled. We discussed diet and exercise.

## 2014-11-08 ENCOUNTER — Ambulatory Visit: Payer: Self-pay | Admitting: Gynecology

## 2014-11-23 ENCOUNTER — Ambulatory Visit: Payer: Managed Care, Other (non HMO) | Admitting: Internal Medicine

## 2015-03-03 ENCOUNTER — Encounter (HOSPITAL_COMMUNITY): Payer: Self-pay | Admitting: *Deleted

## 2015-03-03 ENCOUNTER — Emergency Department (HOSPITAL_COMMUNITY)
Admission: EM | Admit: 2015-03-03 | Discharge: 2015-03-03 | Disposition: A | Discharge status: Home / Self Care | Payer: Managed Care, Other (non HMO) | Attending: Emergency Medicine | Admitting: Emergency Medicine

## 2015-03-03 ENCOUNTER — Emergency Department (HOSPITAL_COMMUNITY): Payer: Managed Care, Other (non HMO)

## 2015-03-03 DIAGNOSIS — E039 Hypothyroidism, unspecified: Secondary | ICD-10-CM | POA: Insufficient documentation

## 2015-03-03 DIAGNOSIS — S20319A Abrasion of unspecified front wall of thorax, initial encounter: Secondary | ICD-10-CM | POA: Insufficient documentation

## 2015-03-03 DIAGNOSIS — E785 Hyperlipidemia, unspecified: Secondary | ICD-10-CM | POA: Insufficient documentation

## 2015-03-03 DIAGNOSIS — K219 Gastro-esophageal reflux disease without esophagitis: Secondary | ICD-10-CM | POA: Diagnosis not present

## 2015-03-03 DIAGNOSIS — Y92410 Unspecified street and highway as the place of occurrence of the external cause: Secondary | ICD-10-CM | POA: Insufficient documentation

## 2015-03-03 DIAGNOSIS — F329 Major depressive disorder, single episode, unspecified: Secondary | ICD-10-CM | POA: Diagnosis not present

## 2015-03-03 DIAGNOSIS — S0990XA Unspecified injury of head, initial encounter: Secondary | ICD-10-CM | POA: Insufficient documentation

## 2015-03-03 DIAGNOSIS — S40012A Contusion of left shoulder, initial encounter: Secondary | ICD-10-CM | POA: Insufficient documentation

## 2015-03-03 DIAGNOSIS — Z79899 Other long term (current) drug therapy: Secondary | ICD-10-CM | POA: Diagnosis not present

## 2015-03-03 DIAGNOSIS — Y998 Other external cause status: Secondary | ICD-10-CM | POA: Insufficient documentation

## 2015-03-03 DIAGNOSIS — Y9389 Activity, other specified: Secondary | ICD-10-CM | POA: Insufficient documentation

## 2015-03-03 DIAGNOSIS — S199XXA Unspecified injury of neck, initial encounter: Secondary | ICD-10-CM | POA: Diagnosis not present

## 2015-03-03 DIAGNOSIS — M1990 Unspecified osteoarthritis, unspecified site: Secondary | ICD-10-CM | POA: Insufficient documentation

## 2015-03-03 MED ORDER — HYDROCODONE-ACETAMINOPHEN 5-325 MG PO TABS
1.0000 | ORAL_TABLET | Freq: Once | ORAL | Status: AC
  Administered 2015-03-03: 1 via ORAL
  Filled 2015-03-03: qty 1

## 2015-03-03 MED ORDER — IBUPROFEN 400 MG PO TABS: 400.0000 mg | ORAL_TABLET | Freq: Three times a day (TID) | ORAL | Status: DC

## 2015-03-03 MED ORDER — METHOCARBAMOL 500 MG PO TABS: 500.0000 mg | ORAL_TABLET | Freq: Once | ORAL | Status: DC

## 2015-03-03 MED ORDER — METHOCARBAMOL 500 MG PO TABS
500.0000 mg | ORAL_TABLET | Freq: Once | ORAL | Status: AC
  Administered 2015-03-03: 500 mg via ORAL
  Filled 2015-03-03: qty 1

## 2015-03-03 MED ORDER — IBUPROFEN 400 MG PO TABS
400.0000 mg | ORAL_TABLET | Freq: Once | ORAL | Status: DC
Start: 2015-03-03 — End: 2015-03-04
  Filled 2015-03-03: qty 1

## 2015-03-03 MED ORDER — HYDROCODONE-ACETAMINOPHEN 5-325 MG PO TABS: ORAL_TABLET | ORAL | Status: DC
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
## 2015-03-04 ENCOUNTER — Telehealth (HOSPITAL_COMMUNITY): Payer: Self-pay

## 2015-03-04 NOTE — Telephone Encounter (Signed)
Pharmacy calling for clarification of Robaxin instructions.  Anne Ng PA consulted.  Instructions should read "1 tablet po BID daily"  Pharmacy informed.

## 2015-05-19 ENCOUNTER — Ambulatory Visit (INDEPENDENT_AMBULATORY_CARE_PROVIDER_SITE_OTHER): Payer: Managed Care, Other (non HMO) | Admitting: Family Medicine

## 2015-05-19 ENCOUNTER — Encounter: Payer: Self-pay | Admitting: Family Medicine

## 2015-05-19 VITALS — BP 99/71 | HR 73 | Temp 98.3°F | Ht 60.0 in | Wt 132.0 lb

## 2015-05-19 DIAGNOSIS — B958 Unspecified staphylococcus as the cause of diseases classified elsewhere: Secondary | ICD-10-CM | POA: Diagnosis not present

## 2015-05-19 DIAGNOSIS — L089 Local infection of the skin and subcutaneous tissue, unspecified: Secondary | ICD-10-CM | POA: Diagnosis not present

## 2015-05-19 MED ORDER — MUPIROCIN 2 % EX OINT: 1.0000 "application " | TOPICAL_OINTMENT | Freq: Two times a day (BID) | CUTANEOUS | Status: DC

## 2015-05-19 MED ORDER — DOXYCYCLINE HYCLATE 100 MG PO CAPS: 100.0000 mg | ORAL_CAPSULE | Freq: Two times a day (BID) | ORAL | Status: AC

## 2015-05-19 NOTE — Progress Notes (Signed)
   Subjective:    Patient ID: Brandy Boyle, female    DOB: 03-16-1951, 64 y.o.   MRN: [id number]  HPI Here for 2 weeks of a rash around the mouth, upper lip and nose which itches and burns. Using cortisone cream with no relief.    Review of Systems  Constitutional: Negative.   HENT: Negative for facial swelling and mouth sores.   Skin: Positive for rash.       Objective:   Physical Exam  Constitutional: She appears well-developed and well-nourished.  Skin:  Superficial red rash as above           Assessment & Plan:  This is a staph infection. Treat with Bactroban ointment and Doxycycline for 30 days

## 2015-05-19 NOTE — Progress Notes (Signed)
Pre visit review using our clinic review tool, if applicable. No additional management support is needed unless otherwise documented below in the visit note. 

## 2015-07-16 ENCOUNTER — Other Ambulatory Visit: Payer: Self-pay | Admitting: Family Medicine

## 2015-07-19 NOTE — Telephone Encounter (Signed)
Call in #[phone] with one rf 

## 2015-08-18 ENCOUNTER — Other Ambulatory Visit: Payer: Self-pay | Admitting: Family Medicine

## 2015-08-19 NOTE — Telephone Encounter (Signed)
Call in #[phone] with one rf 

## 2015-09-29 ENCOUNTER — Other Ambulatory Visit: Payer: Self-pay | Admitting: General Practice

## 2015-09-29 MED ORDER — CITALOPRAM HYDROBROMIDE 20 MG PO TABS: 20.0000 mg | ORAL_TABLET | Freq: Every day | ORAL | Status: DC

## 2015-09-29 MED ORDER — ROSUVASTATIN CALCIUM 10 MG PO TABS: 10.0000 mg | ORAL_TABLET | Freq: Every day | ORAL | Status: DC

## 2015-09-29 MED ORDER — LEVOTHYROXINE SODIUM 50 MCG PO TABS: ORAL_TABLET | ORAL | Status: DC

## 2015-10-18 ENCOUNTER — Telehealth: Payer: Self-pay | Admitting: Family Medicine

## 2015-10-18 MED ORDER — ROSUVASTATIN CALCIUM 10 MG PO TABS: 10.0000 mg | ORAL_TABLET | Freq: Every day | ORAL | Status: DC

## 2015-10-18 MED ORDER — LEVOTHYROXINE SODIUM 50 MCG PO TABS: ORAL_TABLET | ORAL | Status: DC

## 2015-10-18 MED ORDER — CITALOPRAM HYDROBROMIDE 20 MG PO TABS: 20.0000 mg | ORAL_TABLET | Freq: Every day | ORAL | Status: DC

## 2015-10-18 NOTE — Telephone Encounter (Signed)
Can we print script for Citalopram and fax to [facility]?

## 2015-10-18 NOTE — Telephone Encounter (Signed)
Ready to fax  

## 2015-10-18 NOTE — Telephone Encounter (Signed)
Refill request for Rosuvastatin 10 mg, Levothyroxine 50 mcg, Citalopram 20 mg and send to [facility].

## 2015-10-18 NOTE — Telephone Encounter (Signed)
I spoke with pt, she requested a 30 day supply of Crestor to [facility] while waiting on mail order. I also advised pt to schedule a office visit and she will need lab work. I called in 1 month of Crestor and also sents e-scribe to [facility].

## 2015-10-18 NOTE — Telephone Encounter (Signed)
Scripts were faxed to mail order and I spoke with pt.

## 2015-12-05 DIAGNOSIS — H1131 Conjunctival hemorrhage, right eye: Secondary | ICD-10-CM | POA: Diagnosis not present

## 2015-12-13 ENCOUNTER — Ambulatory Visit (INDEPENDENT_AMBULATORY_CARE_PROVIDER_SITE_OTHER): Payer: Commercial Managed Care - HMO | Admitting: Family Medicine

## 2015-12-13 ENCOUNTER — Encounter: Payer: Self-pay | Admitting: Family Medicine

## 2015-12-13 VITALS — BP 104/71 | HR 65 | Temp 98.2°F | Ht 60.0 in | Wt 132.0 lb

## 2015-12-13 DIAGNOSIS — Z0000 Encounter for general adult medical examination without abnormal findings: Secondary | ICD-10-CM

## 2015-12-13 DIAGNOSIS — E785 Hyperlipidemia, unspecified: Secondary | ICD-10-CM | POA: Diagnosis not present

## 2015-12-13 LAB — BASIC METABOLIC PANEL
BUN: 10 mg/dL (ref 6–23)
CALCIUM: 9.7 mg/dL (ref 8.4–10.5)
CO2: 31 meq/L (ref 19–32)
CREATININE: 0.88 mg/dL (ref 0.40–1.20)
Chloride: 103 mEq/L (ref 96–112)
GFR: 68.51 mL/min (ref >60.00)
Glucose, Bld: 92 mg/dL (ref 70–99)
Potassium: 3.6 mEq/L (ref 3.5–5.1)
SODIUM: 140 meq/L (ref 135–145)

## 2015-12-13 LAB — LIPID PANEL
CHOL/HDL RATIO: 3
Cholesterol: 165 mg/dL (ref 0–200)
HDL: 51.2 mg/dL (ref >39.00)
LDL Cholesterol: 94 mg/dL (ref 0–99)
NONHDL: 113.88
Triglycerides: 100 mg/dL (ref 0.0–149.0)
VLDL: 20 mg/dL (ref 0.0–40.0)

## 2015-12-13 LAB — HEPATIC FUNCTION PANEL
ALK PHOS: 36 U/L — AB (ref 39–117)
ALT: 16 U/L (ref 0–35)
AST: 21 U/L (ref 0–37)
Albumin: 4.4 g/dL (ref 3.5–5.2)
BILIRUBIN DIRECT: 0.1 mg/dL (ref 0.0–0.3)
BILIRUBIN TOTAL: 0.5 mg/dL (ref 0.2–1.2)
TOTAL PROTEIN: 7 g/dL (ref 6.0–8.3)

## 2015-12-13 LAB — POC URINALSYSI DIPSTICK (AUTOMATED)
BILIRUBIN UA: NEGATIVE
GLUCOSE UA: NEGATIVE
Ketones, UA: NEGATIVE
Leukocytes, UA: NEGATIVE
NITRITE UA: NEGATIVE
Spec Grav, UA: 1.02
Urobilinogen, UA: 0.2
pH, UA: 7

## 2015-12-13 LAB — TSH: TSH: 0.86 u[IU]/mL (ref 0.35–4.50)

## 2015-12-13 NOTE — Progress Notes (Signed)
   Subjective:    Patient ID: Brandy Boyle, female    DOB: 1950-10-06, 65 y.o.   MRN: [id number]  HPI 65 yr old female for a well exam. She feels fine physically but she has been under a lot of stress with family issues. Two of her daughters are going through divorces and one of her grandsons has gotten involved with illicit drugs. She worries a lot about them. She recently joined a [employer] and plans on taking yoga classes.    Review of Systems  Constitutional: Negative.   HENT: Negative.   Eyes: Negative.   Respiratory: Negative.   Cardiovascular: Negative.   Gastrointestinal: Negative.   Genitourinary: Negative for dysuria, urgency, frequency, hematuria, flank pain, decreased urine volume, enuresis, difficulty urinating, pelvic pain and dyspareunia.  Musculoskeletal: Negative.   Skin: Negative.   Neurological: Negative.   Psychiatric/Behavioral: Positive for dysphoric mood. Negative for suicidal ideas, hallucinations, behavioral problems, confusion, self-injury and agitation. The patient is nervous/anxious.        Objective:   Physical Exam  Constitutional: She is oriented to person, place, and time. She appears well-developed and well-nourished. No distress.  HENT:  Head: Normocephalic and atraumatic.  Right Ear: External ear normal.  Left Ear: External ear normal.  Nose: Nose normal.  Mouth/Throat: Oropharynx is clear and moist. No oropharyngeal exudate.  Eyes: Conjunctivae and EOM are normal. Pupils are equal, round, and reactive to light. No scleral icterus.  Neck: Normal range of motion. Neck supple. No JVD present. No thyromegaly present.  Cardiovascular: Normal rate, regular rhythm, normal heart sounds and intact distal pulses.  Exam reveals no gallop and no friction rub.   No murmur heard. EKG normal   Pulmonary/Chest: Effort normal and breath sounds normal. No respiratory distress. She has no wheezes. She has no rales. She exhibits no tenderness.  Abdominal: Soft.  Bowel sounds are normal. She exhibits no distension and no mass. There is no tenderness. There is no rebound and no guarding.  Musculoskeletal: Normal range of motion. She exhibits no edema or tenderness.  Lymphadenopathy:    She has no cervical adenopathy.  Neurological: She is alert and oriented to person, place, and time. She has normal reflexes. No cranial nerve deficit. She exhibits normal muscle tone. Coordination normal.  Skin: Skin is warm and dry. No rash noted. No erythema.  Psychiatric: She has a normal mood and affect. Her behavior is normal. Judgment and thought content normal.          Assessment & Plan:  Well exam. We discussed diet and exercise. Get fasting labs today.  Laurey Morale, MD

## 2015-12-14 LAB — CBC WITH DIFFERENTIAL/PLATELET
BASOS PCT: 0.4 % (ref 0.0–3.0)
Basophils Absolute: 0 10*3/uL (ref 0.0–0.1)
EOS ABS: 0.3 10*3/uL (ref 0.0–0.7)
EOS PCT: 6.1 % — AB (ref 0.0–5.0)
HEMATOCRIT: 42.8 % (ref 36.0–46.0)
HEMOGLOBIN: 14.6 g/dL (ref 12.0–15.0)
LYMPHS PCT: 33.4 % (ref 12.0–46.0)
Lymphs Abs: 1.6 10*3/uL (ref 0.7–4.0)
MCHC: 34.1 g/dL (ref 30.0–36.0)
MCV: 92.9 fl (ref 78.0–100.0)
MONOS PCT: 5.8 % (ref 3.0–12.0)
Monocytes Absolute: 0.3 10*3/uL (ref 0.1–1.0)
NEUTROS ABS: 2.6 10*3/uL (ref 1.4–7.7)
Neutrophils Relative %: 54.3 % (ref 43.0–77.0)
PLATELETS: 225 10*3/uL (ref 150.0–400.0)
RBC: 4.61 Mil/uL (ref 3.87–5.11)
RDW: 13.9 % (ref 11.5–15.5)
WBC: 4.7 10*3/uL (ref 4.0–10.5)

## 2015-12-30 ENCOUNTER — Other Ambulatory Visit: Payer: Self-pay | Admitting: Family Medicine

## 2016-01-02 ENCOUNTER — Telehealth: Payer: Self-pay | Admitting: Family Medicine

## 2016-01-02 MED ORDER — TEMAZEPAM 30 MG PO CAPS: 30.0000 mg | ORAL_CAPSULE | Freq: Every evening | ORAL | 1 refills | Status: DC | PRN

## 2016-01-02 MED ORDER — CLONAZEPAM 1 MG PO TABS: 1.0000 mg | ORAL_TABLET | Freq: Three times a day (TID) | ORAL | 1 refills | Status: DC | PRN

## 2016-01-02 NOTE — Telephone Encounter (Signed)
°  Pt call to ask for refills on the following med    clonazePAM (KLONOPIN) 1 MG tablet   temazepam (RESTORIL) 30 MG capsule    Pharmacy   [facility] mail order

## 2016-01-02 NOTE — Telephone Encounter (Signed)
done

## 2016-01-03 NOTE — Telephone Encounter (Signed)
Scripts were printed and faxed to [facility].

## 2016-01-31 ENCOUNTER — Encounter: Payer: Self-pay | Admitting: Family Medicine

## 2016-01-31 ENCOUNTER — Ambulatory Visit (INDEPENDENT_AMBULATORY_CARE_PROVIDER_SITE_OTHER): Payer: Commercial Managed Care - HMO | Admitting: Family Medicine

## 2016-01-31 VITALS — BP 100/77 | HR 73 | Temp 97.7°F | Ht 60.0 in | Wt 133.0 lb

## 2016-01-31 DIAGNOSIS — N390 Urinary tract infection, site not specified: Secondary | ICD-10-CM

## 2016-01-31 DIAGNOSIS — R350 Frequency of micturition: Secondary | ICD-10-CM | POA: Diagnosis not present

## 2016-01-31 LAB — POC URINALSYSI DIPSTICK (AUTOMATED)
Nitrite, UA: POSITIVE
Urobilinogen, UA: >=8
pH, UA: 5

## 2016-01-31 MED ORDER — CIPROFLOXACIN HCL 500 MG PO TABS: 500.0000 mg | ORAL_TABLET | Freq: Two times a day (BID) | ORAL | 0 refills | Status: DC

## 2016-01-31 NOTE — Progress Notes (Signed)
Pre visit review using our clinic review tool, if applicable. No additional management support is needed unless otherwise documented below in the visit note. 

## 2016-01-31 NOTE — Progress Notes (Signed)
   Subjective:    Patient ID: Brandy Boyle, female    DOB: 13-Nov-1950, 65 y.o.   MRN: [id number]  HPI Here for 2 days of frequent urinations and burning. Using AZO.    Review of Systems  Constitutional: Negative.   Gastrointestinal: Negative.   Genitourinary: Positive for dysuria, frequency and urgency. Negative for flank pain, hematuria and pelvic pain.       Objective:   Physical Exam  Constitutional: She appears well-developed and well-nourished.  Abdominal: Soft. Bowel sounds are normal. She exhibits no distension and no mass. There is no tenderness. There is no rebound and no guarding.          Assessment & Plan:  UTI. Treat with Cipro.  Laurey Morale, MD

## 2016-02-02 LAB — URINE CULTURE

## 2016-02-09 ENCOUNTER — Other Ambulatory Visit: Payer: Self-pay | Admitting: Family Medicine

## 2016-08-21 ENCOUNTER — Other Ambulatory Visit: Payer: Self-pay | Admitting: Family Medicine

## 2016-08-30 DIAGNOSIS — R69 Illness, unspecified: Secondary | ICD-10-CM | POA: Diagnosis not present

## 2016-10-10 ENCOUNTER — Encounter: Payer: Self-pay | Admitting: Gynecology

## 2016-11-05 DIAGNOSIS — H5203 Hypermetropia, bilateral: Secondary | ICD-10-CM | POA: Diagnosis not present

## 2016-11-27 ENCOUNTER — Other Ambulatory Visit: Payer: Self-pay | Admitting: Family Medicine

## 2016-12-12 DIAGNOSIS — Z01419 Encounter for gynecological examination (general) (routine) without abnormal findings: Secondary | ICD-10-CM | POA: Diagnosis not present

## 2016-12-12 DIAGNOSIS — D259 Leiomyoma of uterus, unspecified: Secondary | ICD-10-CM | POA: Diagnosis not present

## 2016-12-12 DIAGNOSIS — Z1231 Encounter for screening mammogram for malignant neoplasm of breast: Secondary | ICD-10-CM | POA: Diagnosis not present

## 2016-12-19 ENCOUNTER — Encounter: Payer: Self-pay | Admitting: Family Medicine

## 2016-12-19 ENCOUNTER — Ambulatory Visit (INDEPENDENT_AMBULATORY_CARE_PROVIDER_SITE_OTHER): Payer: Medicare HMO | Admitting: Family Medicine

## 2016-12-19 VITALS — BP 108/84 | HR 70 | Temp 97.8°F | Ht 60.0 in | Wt 138.0 lb

## 2016-12-19 DIAGNOSIS — R110 Nausea: Secondary | ICD-10-CM | POA: Diagnosis not present

## 2016-12-19 DIAGNOSIS — F325 Major depressive disorder, single episode, in full remission: Secondary | ICD-10-CM

## 2016-12-19 DIAGNOSIS — F411 Generalized anxiety disorder: Secondary | ICD-10-CM | POA: Diagnosis not present

## 2016-12-19 DIAGNOSIS — E782 Mixed hyperlipidemia: Secondary | ICD-10-CM

## 2016-12-19 DIAGNOSIS — K219 Gastro-esophageal reflux disease without esophagitis: Secondary | ICD-10-CM

## 2016-12-19 DIAGNOSIS — E039 Hypothyroidism, unspecified: Secondary | ICD-10-CM | POA: Diagnosis not present

## 2016-12-19 LAB — HEPATIC FUNCTION PANEL
ALBUMIN: 4.1 g/dL (ref 3.5–5.2)
ALK PHOS: 41 U/L (ref 39–117)
ALT: 17 U/L (ref 0–35)
AST: 21 U/L (ref 0–37)
Bilirubin, Direct: 0.1 mg/dL (ref 0.0–0.3)
TOTAL PROTEIN: 6.7 g/dL (ref 6.0–8.3)
Total Bilirubin: 0.7 mg/dL (ref 0.2–1.2)

## 2016-12-19 LAB — CBC WITH DIFFERENTIAL/PLATELET
BASOS PCT: 0.9 % (ref 0.0–3.0)
Basophils Absolute: 0 10*3/uL (ref 0.0–0.1)
EOS ABS: 0.3 10*3/uL (ref 0.0–0.7)
Eosinophils Relative: 6.8 % — ABNORMAL HIGH (ref 0.0–5.0)
HEMATOCRIT: 43.6 % (ref 36.0–46.0)
Hemoglobin: 14.8 g/dL (ref 12.0–15.0)
LYMPHS ABS: 1.9 10*3/uL (ref 0.7–4.0)
Lymphocytes Relative: 37.2 % (ref 12.0–46.0)
MCHC: 33.9 g/dL (ref 30.0–36.0)
MCV: 93.8 fl (ref 78.0–100.0)
Monocytes Absolute: 0.5 10*3/uL (ref 0.1–1.0)
Monocytes Relative: 9 % (ref 3.0–12.0)
NEUTROS ABS: 2.4 10*3/uL (ref 1.4–7.7)
NEUTROS PCT: 46.1 % (ref 43.0–77.0)
PLATELETS: 207 10*3/uL (ref 150.0–400.0)
RBC: 4.64 Mil/uL (ref 3.87–5.11)
RDW: 13.7 % (ref 11.5–15.5)
WBC: 5.1 10*3/uL (ref 4.0–10.5)

## 2016-12-19 LAB — LIPID PANEL
CHOLESTEROL: 184 mg/dL (ref 0–200)
HDL: 51 mg/dL (ref >39.00)
LDL Cholesterol: 108 mg/dL — ABNORMAL HIGH (ref 0–99)
NonHDL: 133.26
TRIGLYCERIDES: 125 mg/dL (ref 0.0–149.0)
Total CHOL/HDL Ratio: 4
VLDL: 25 mg/dL (ref 0.0–40.0)

## 2016-12-19 LAB — BASIC METABOLIC PANEL
BUN: 10 mg/dL (ref 6–23)
CHLORIDE: 104 meq/L (ref 96–112)
CO2: 32 meq/L (ref 19–32)
Calcium: 9.4 mg/dL (ref 8.4–10.5)
Creatinine, Ser: 0.83 mg/dL (ref 0.40–1.20)
GFR: 73.06 mL/min (ref >60.00)
GLUCOSE: 96 mg/dL (ref 70–99)
POTASSIUM: 3.6 meq/L (ref 3.5–5.1)
SODIUM: 140 meq/L (ref 135–145)

## 2016-12-19 LAB — POC URINALSYSI DIPSTICK (AUTOMATED)
BILIRUBIN UA: NEGATIVE
Clarity, UA: NEGATIVE
GLUCOSE UA: NEGATIVE
NITRITE UA: NEGATIVE
PH UA: 6.5 (ref 5.0–8.0)
Protein, UA: NEGATIVE
RBC UA: NEGATIVE
Spec Grav, UA: 1.015 (ref 1.010–1.025)
Urobilinogen, UA: 0.2 E.U./dL

## 2016-12-19 LAB — TSH: TSH: 0.94 u[IU]/mL (ref 0.35–4.50)

## 2016-12-19 MED ORDER — LEVOTHYROXINE SODIUM 50 MCG PO TABS: 50.0000 ug | ORAL_TABLET | Freq: Every day | ORAL | 3 refills | Status: DC

## 2016-12-19 MED ORDER — ROSUVASTATIN CALCIUM 10 MG PO TABS: 10.0000 mg | ORAL_TABLET | Freq: Every day | ORAL | 3 refills | Status: DC

## 2016-12-19 MED ORDER — TEMAZEPAM 30 MG PO CAPS: 30.0000 mg | ORAL_CAPSULE | Freq: Every evening | ORAL | 1 refills | Status: DC | PRN

## 2016-12-19 MED ORDER — ONDANSETRON HCL 4 MG PO TABS: 4.0000 mg | ORAL_TABLET | Freq: Four times a day (QID) | ORAL | 3 refills | Status: DC

## 2016-12-19 MED ORDER — PROCHLORPERAZINE 25 MG RE SUPP: 25.0000 mg | Freq: Two times a day (BID) | RECTAL | 3 refills | Status: DC | PRN

## 2016-12-19 NOTE — Patient Instructions (Signed)
WE NOW OFFER   [name] FAST TRACK!!!  SAME DAY Appointments for ACUTE CARE  Such as: Sprains, Injuries, cuts, abrasions, rashes, muscle pain, joint pain, back pain Colds, flu, sore throats, headache, allergies, cough, fever  Ear pain, sinus and eye infections Abdominal pain, nausea, vomiting, diarrhea, upset stomach Animal/insect bites  3 Easy Ways to Schedule: Walk-In Scheduling Call in scheduling Mychart Sign-up: [email or web address]         

## 2016-12-19 NOTE — Progress Notes (Signed)
   Subjective:    Patient ID: Brandy Boyle, female    DOB: 12-17-1950, 66 y.o.   MRN: [id number]  HPI Here to follow up on several issues. Her anxiety and depression are well controlled. 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 She sleeps well. No real concerns today.   Review of Systems  Constitutional: Negative.   HENT: Negative.   Eyes: Negative.   Respiratory: Negative.   Cardiovascular: Negative.   Gastrointestinal: Negative.   Genitourinary: Negative for decreased urine volume, difficulty urinating, dyspareunia, dysuria, enuresis, flank pain, frequency, hematuria, pelvic pain and urgency.  Musculoskeletal: Negative.   Skin: Negative.   Neurological: Negative.   Psychiatric/Behavioral: Negative.        Objective:   Physical Exam  Constitutional: She is oriented to person, place, and time. She appears well-developed and well-nourished. No distress.  HENT:  Head: Normocephalic and atraumatic.  Right Ear: External ear normal.  Left Ear: External ear normal.  Nose: Nose normal.  Mouth/Throat: Oropharynx is clear and moist. No oropharyngeal exudate.  Eyes: Pupils are equal, round, and reactive to light. Conjunctivae and EOM are normal. No scleral icterus.  Neck: Normal range of motion. Neck supple. No JVD present. No thyromegaly present.  Cardiovascular: Normal rate, regular rhythm, normal heart sounds and intact distal pulses.  Exam reveals no gallop and no friction rub.   No murmur heard. Pulmonary/Chest: Effort normal and breath sounds normal. No respiratory distress. She has no wheezes. She has no rales. She exhibits no tenderness.  Abdominal: Soft. Bowel sounds are normal. She exhibits no distension and no mass. There is no tenderness. There is no rebound and no guarding.  Musculoskeletal: Normal range of motion. She exhibits no edema or tenderness.  Lymphadenopathy:    She has no cervical adenopathy.  Neurological: She is alert and  oriented to person, place, and time. She has normal reflexes. No cranial nerve deficit. She exhibits normal muscle tone. Coordination normal.  Skin: Skin is warm and dry. No rash noted. No erythema.  Psychiatric: She has a normal mood and affect. Her behavior is normal. Judgment and thought content normal.          Assessment & Plan:  Her anxiety and depression and insomnia are stable. Her chronic nausea is stable but we refilled her meds to use as needed. Get fasting labs today to check her lipids and her thyroid level.  Alysia Penna, MD

## 2016-12-25 ENCOUNTER — Telehealth: Payer: Self-pay

## 2016-12-25 NOTE — Telephone Encounter (Signed)
Received PA request for Zofran. PA submitted & pending. Key: [id number]

## 2016-12-28 NOTE — Telephone Encounter (Signed)
PA denied. The Medicare rule in the Prescription Drug Manual says an off-label use of a drug is a use that is not included on the drug's label as approved by the FDA.

## 2016-12-31 NOTE — Telephone Encounter (Signed)
I spoke with pt and she will check with local pharmacy for cost of number 15 or so, she will let us know if she wants script.

## 2017-01-18 ENCOUNTER — Telehealth: Payer: Self-pay | Admitting: Family Medicine

## 2017-01-18 NOTE — Telephone Encounter (Signed)
I called pt to ask about recent mammogram, she had one on December 12, 2016 and I did update in her chart.

## 2017-05-04 IMAGING — CT CT ABD-PELV W/ CM
2 of 5 series · 17 of 46 positions shown, 19 images · IV contrast (omnipaque)
Comparison: None.

CLINICAL DATA: Epigastric pain with vomiting for approximately 7
months. Abdominal distention.

EXAM:
CT ABDOMEN AND PELVIS WITH CONTRAST
TECHNIQUE: Multidetector CT imaging of the abdomen and pelvis was performed
using the standard protocol following bolus administration of
intravenous contrast.
CONTRAST:  100mL OMNIPAQUE IOHEXOL 300 MG/ML  SOLN

[Series 2: abd/ pel 5mm · axial · 0.65mm/px · z∈[-432,-42]mm · 14 of 88 slices shown, 16 images]
[im 5/88  soft-tissue]
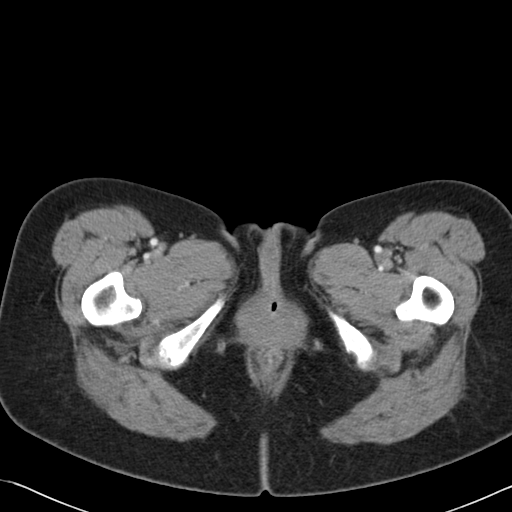
[im 5/88  bone]
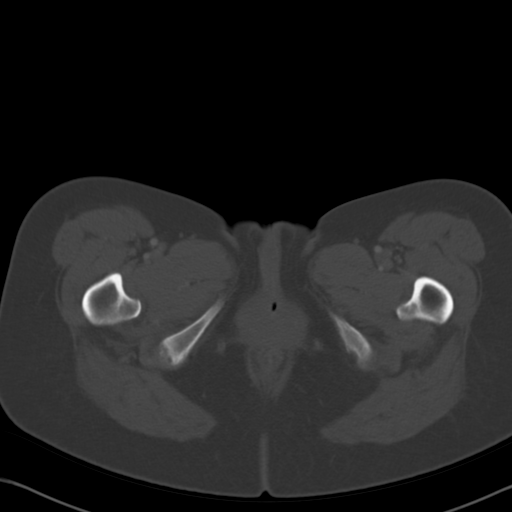
[im 14/88  soft-tissue]
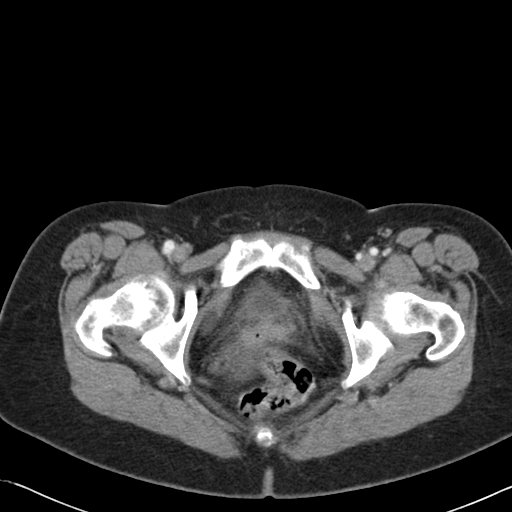
[im 18/88  soft-tissue]
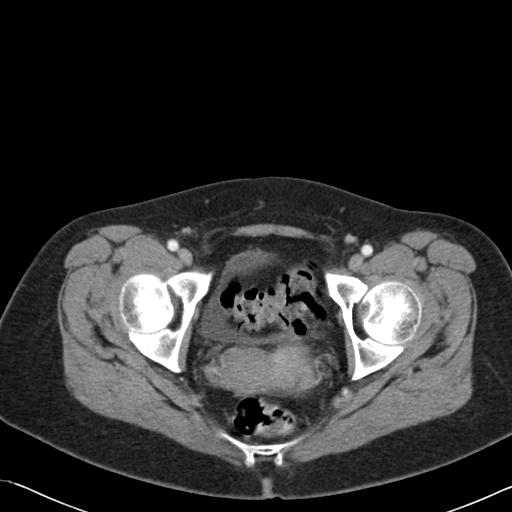
[im 22/88  soft-tissue]
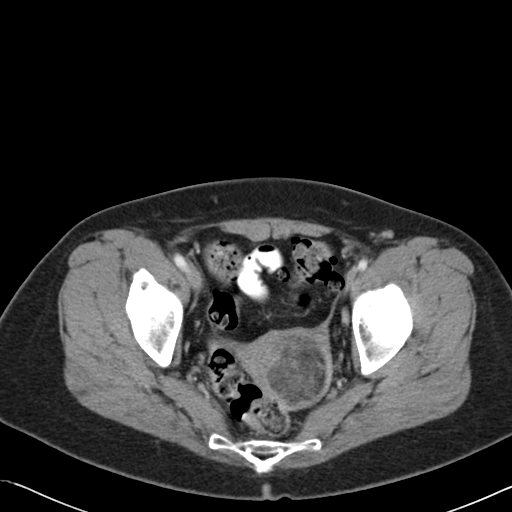
[im 31/88  soft-tissue]
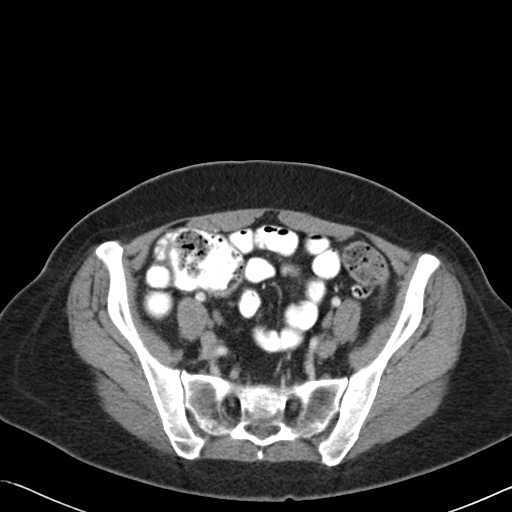
[im 35/88  soft-tissue]
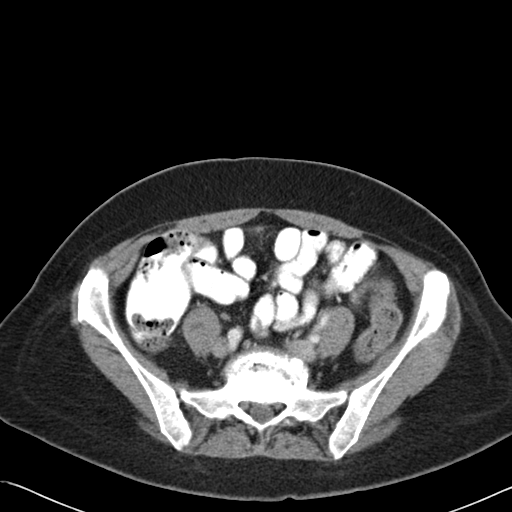
[im 40/88  soft-tissue]
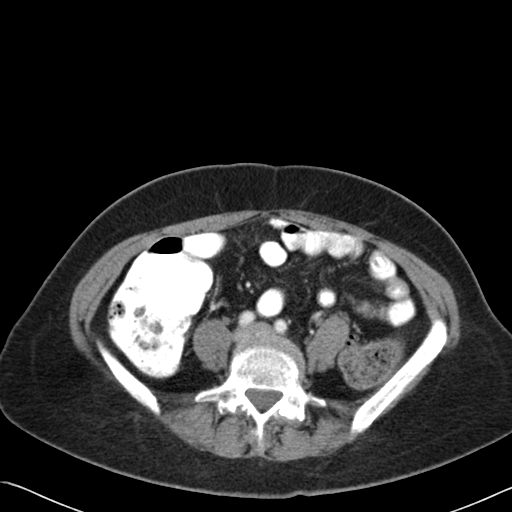
[im 48/88  soft-tissue]
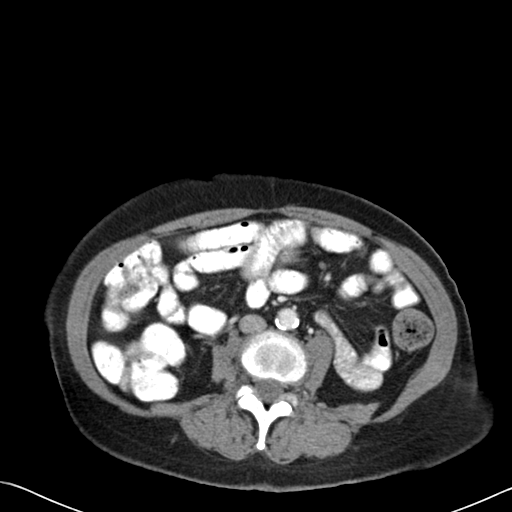
[im 53/88  soft-tissue]
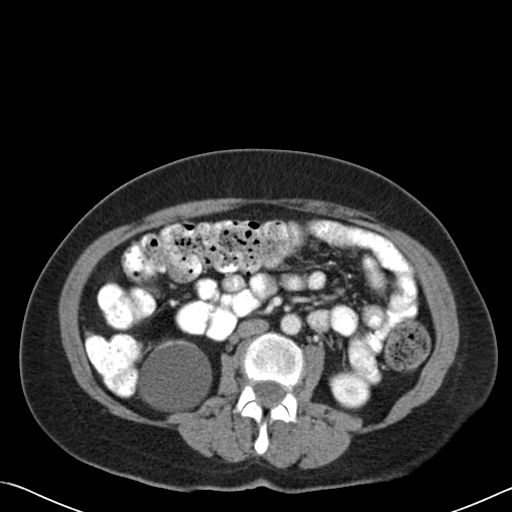
[im 53/88  bone]
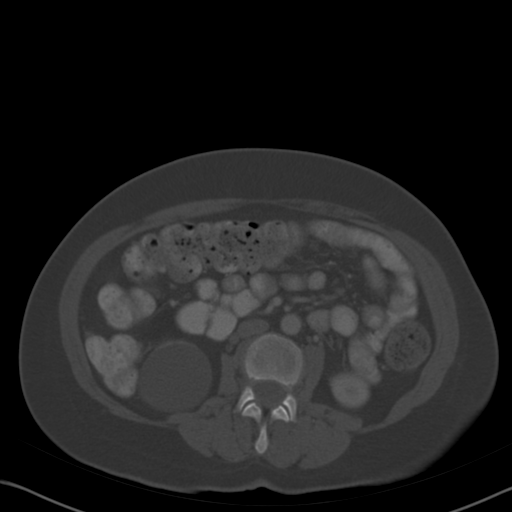
[im 57/88  soft-tissue]
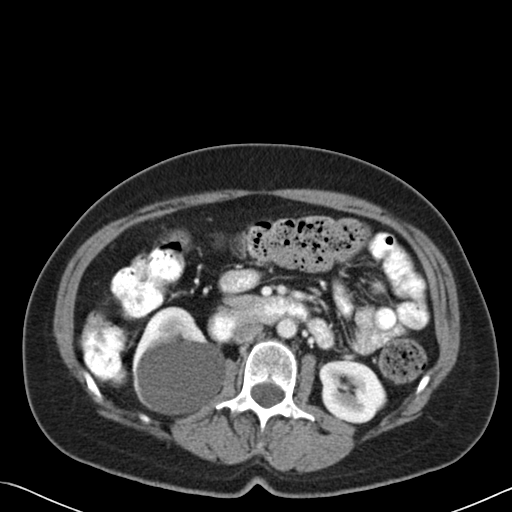
[im 66/88  soft-tissue]
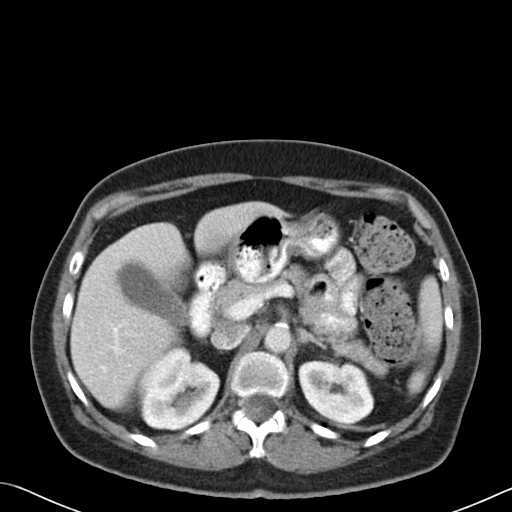
[im 70/88  soft-tissue]
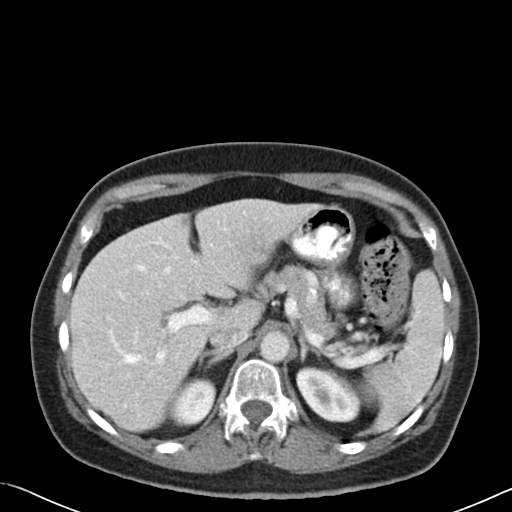
[im 74/88  soft-tissue]
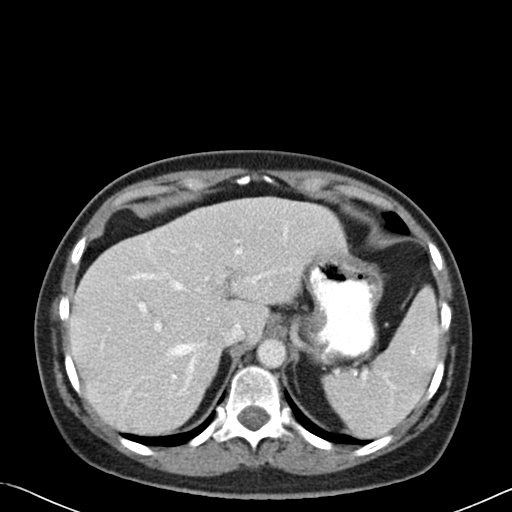
[im 83/88  soft-tissue]
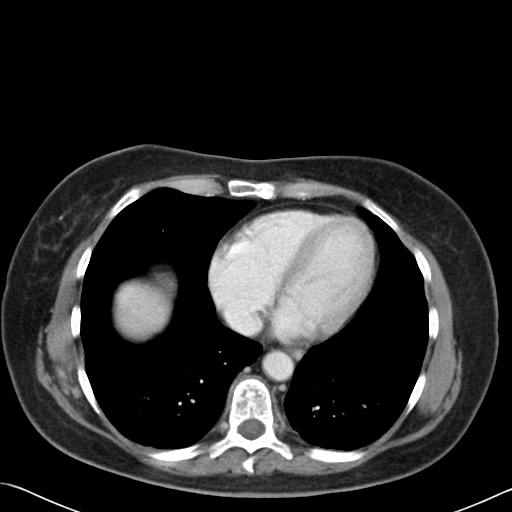

[Series 602: cor · coronal · 0.88mm/px · 3 of 111 slices shown]
[im 37/111  soft-tissue]
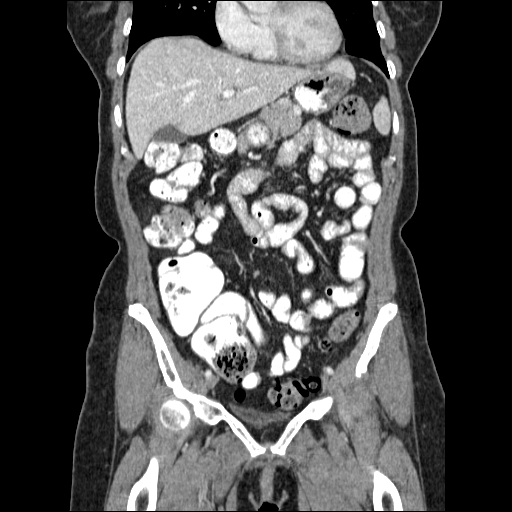
[im 49/111  soft-tissue]
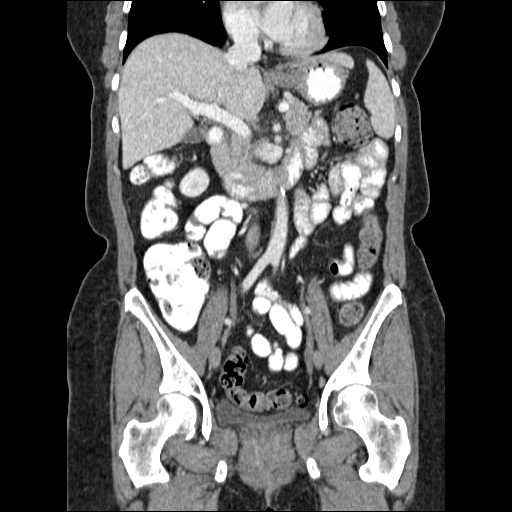
[im 62/111  soft-tissue]
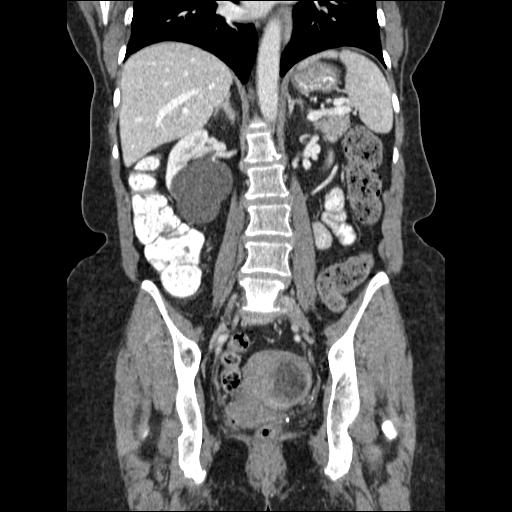

[17 of 46 positions shown; findings below may reference images not displayed]

FINDINGS: Lower Chest:  Unremarkable.

Hepatobiliary: No masses or other significant abnormality
identified. Gallbladder is unremarkable.

Pancreas: No mass, inflammatory changes, or other significant
abnormality identified.

Spleen:  Within normal limits in size and appearance.

Adrenals:  No masses identified.

Kidneys/Urinary Tract: No evidence of masses or hydronephrosis.
Simple benign-appearing cyst noted in lower pole of right kidney
measuring 6.0 cm.

Stomach/Bowel/Peritoneum: No evidence of wall thickening, mass, or
obstruction.

Vascular/Lymphatic: No pathologically enlarged lymph nodes
identified. No other significant abnormality visualized.

Reproductive: A 4.5 cm myometrial mass is seen in the uterus which
contains areas of macroscopic fat, consistent with a benign
lipoleiomyoma. No adnexal masses or free fluid identified.

Other:  None.

Musculoskeletal:  No suspicious bone lesions identified.
IMPRESSION: No acute findings within the abdomen or pelvis.

6 cm benign right lower pole renal cyst.

4.5 cm benign uterine lipoleiomyoma.

## 2017-07-02 ENCOUNTER — Telehealth: Payer: Self-pay | Admitting: Family Medicine

## 2017-07-02 ENCOUNTER — Encounter: Payer: Self-pay | Admitting: Family Medicine

## 2017-07-02 ENCOUNTER — Ambulatory Visit (INDEPENDENT_AMBULATORY_CARE_PROVIDER_SITE_OTHER): Payer: Medicare HMO | Admitting: Family Medicine

## 2017-07-02 VITALS — BP 98/62 | HR 80 | Temp 98.2°F | Wt 139.4 lb

## 2017-07-02 DIAGNOSIS — Z111 Encounter for screening for respiratory tuberculosis: Secondary | ICD-10-CM | POA: Diagnosis not present

## 2017-07-02 DIAGNOSIS — G43A0 Cyclical vomiting, not intractable: Secondary | ICD-10-CM | POA: Diagnosis not present

## 2017-07-02 MED ORDER — MORPHINE SULFATE 15 MG PO TABS: 15.0000 mg | ORAL_TABLET | Freq: Four times a day (QID) | ORAL | 0 refills | Status: DC | PRN

## 2017-07-02 MED ORDER — PROCHLORPERAZINE 25 MG RE SUPP: 25.0000 mg | Freq: Three times a day (TID) | RECTAL | 2 refills | Status: DC | PRN

## 2017-07-02 MED ORDER — ONDANSETRON HCL 8 MG PO TABS
8.0000 mg | ORAL_TABLET | Freq: Three times a day (TID) | ORAL | 2 refills | Status: DC | PRN
Start: 2017-07-02 — End: 2018-07-03

## 2017-07-02 NOTE — Progress Notes (Signed)
   Subjective:    Patient ID: Brandy Boyle, female    DOB: 05-Jun-1950, 67 y.o.   MRN: [id number]  HPI Here for a 24 hour episode several days ago of her typical cyclic vomiting syndrome. She had severe abdominal cramps with nausea and vomiting. She used her typical Compazine and Zofran but it did not help as much as it usually does. No fever or diarrhea.    Review of Systems  Constitutional: Negative.   Respiratory: Negative.   Cardiovascular: Negative.   Gastrointestinal: Positive for abdominal distention, abdominal pain, nausea and vomiting. Negative for blood in stool, constipation, diarrhea and rectal pain.  Genitourinary: Negative.        Objective:   Physical Exam  Constitutional: She appears well-developed and well-nourished.  HENT:  Right Ear: External ear normal.  Left Ear: External ear normal.  Nose: Nose normal.  Mouth/Throat: Oropharynx is clear and moist.  Eyes: Conjunctivae are normal.  Neck: No thyromegaly present.  Cardiovascular: Normal rate, regular rhythm, normal heart sounds and intact distal pulses.  Pulmonary/Chest: Effort normal and breath sounds normal. No respiratory distress. She has no rales.  Abdominal: Soft. Bowel sounds are normal. She exhibits no distension and no mass. There is no tenderness. There is no rebound and no guarding.  Lymphadenopathy:    She has no cervical adenopathy.          Assessment & Plan:  Cyclical vomiting syndrome. We refilled Zofran and Compazine. She will try morphine sulfate for the pain the next time this flares up again.  Alysia Penna, MD

## 2017-07-02 NOTE — Telephone Encounter (Signed)
Routing back to office 

## 2017-07-02 NOTE — Progress Notes (Signed)
Tuberculin skin test applied to left lower arm ventral forearm. Explained how to read the test, measuring induration not just erythema; she will come into office if test appears positive.   Pt will be coming back on Thursday to have PPD test read. Pt advised must have it read within 48-72 hours. Pt advised and voiced understanding.

## 2017-07-02 NOTE — Telephone Encounter (Signed)
Copied from [place]. Topic: Quick Communication - See Telephone Encounter >> Jul 02, 2017  4:51 PM Vernona Rieger wrote: CRM for notification. See Telephone encounter for:   07/02/17.  [facility] called about the Morphine 15mg  tablets. 1 every 6 hours needed for pain. Which would be like 60. They could only dispense only 50 or less on the first fill. She wants to know if they can add max 3 tabs per day so they can fill it. Please advise.   [phone] fax

## 2017-07-02 NOTE — Addendum Note (Signed)
Addended by: Myriam Forehand on: 07/02/2017 04:40 PM   Modules accepted: Orders

## 2017-07-03 NOTE — Telephone Encounter (Signed)
Called [facility] and spoke with pharmacist and advise her that Dr. Sarajane Jews gave them the OK for the changes they requested.

## 2017-07-03 NOTE — Telephone Encounter (Signed)
Sent to PCP ?

## 2017-07-03 NOTE — Telephone Encounter (Signed)
Tell the pharmacy it is okay to change the # to 50 and to say "may take a max of 3 a day"

## 2017-07-04 LAB — TB SKIN TEST
Induration: 0 mm
TB SKIN TEST: NEGATIVE

## 2017-09-16 ENCOUNTER — Other Ambulatory Visit: Payer: Self-pay | Admitting: Family Medicine

## 2017-09-17 NOTE — Telephone Encounter (Signed)
Last OV 07/02/2017   08/22/2016 90 days supply 3 refills   Rx sent

## 2017-11-12 ENCOUNTER — Other Ambulatory Visit: Payer: Self-pay | Admitting: Family Medicine

## 2017-11-12 NOTE — Telephone Encounter (Signed)
Please advise on refill.   Last OV 07/02/17  Last lab 12/19/16  No follow up scheduled.

## 2018-01-07 ENCOUNTER — Encounter: Payer: Self-pay | Admitting: Family Medicine

## 2018-01-07 ENCOUNTER — Ambulatory Visit (INDEPENDENT_AMBULATORY_CARE_PROVIDER_SITE_OTHER): Payer: Medicare HMO | Admitting: Family Medicine

## 2018-01-07 VITALS — BP 98/64 | HR 83 | Temp 98.3°F | Ht 60.0 in | Wt 135.2 lb

## 2018-01-07 DIAGNOSIS — K219 Gastro-esophageal reflux disease without esophagitis: Secondary | ICD-10-CM | POA: Diagnosis not present

## 2018-01-07 DIAGNOSIS — E039 Hypothyroidism, unspecified: Secondary | ICD-10-CM

## 2018-01-07 DIAGNOSIS — F418 Other specified anxiety disorders: Secondary | ICD-10-CM | POA: Diagnosis not present

## 2018-01-07 DIAGNOSIS — E782 Mixed hyperlipidemia: Secondary | ICD-10-CM

## 2018-01-07 LAB — POC URINALSYSI DIPSTICK (AUTOMATED)
BILIRUBIN UA: NEGATIVE
Blood, UA: NEGATIVE
Glucose, UA: NEGATIVE
Ketones, UA: NEGATIVE
LEUKOCYTES UA: NEGATIVE
NITRITE UA: NEGATIVE
Protein, UA: NEGATIVE
Spec Grav, UA: 1.01 (ref 1.010–1.025)
Urobilinogen, UA: 0.2 E.U./dL
pH, UA: 7.5 (ref 5.0–8.0)

## 2018-01-07 MED ORDER — DULOXETINE HCL 30 MG PO CPEP: 30.0000 mg | ORAL_CAPSULE | Freq: Every day | ORAL | 3 refills | Status: DC

## 2018-01-07 NOTE — Progress Notes (Signed)
   Subjective:    Patient ID: Brandy Boyle, female    DOB: [birth year], 67 y.o.   MRN: [id number]  HPI Here to follow up on issues. She feels well physically but she has been more depressed lately. She lives alone and her children live away from her, so she feels isolated and sad. She tries to stay busy. Sleep and appetite are not affected. She has been on Celexa for many years now. Her GERD is well controlled. We have been following her thyroid and lipid levels.    Review of Systems  Constitutional: Negative.   HENT: Negative.   Eyes: Negative.   Respiratory: Negative.   Cardiovascular: Negative.   Gastrointestinal: Negative.   Genitourinary: Negative for decreased urine volume, difficulty urinating, dyspareunia, dysuria, enuresis, flank pain, frequency, hematuria, pelvic pain and urgency.  Musculoskeletal: Negative.   Skin: Negative.   Neurological: Negative.   Psychiatric/Behavioral: Positive for dysphoric mood.       Objective:   Physical Exam  Constitutional: She is oriented to person, place, and time. She appears well-developed and well-nourished. No distress.  HENT:  Head: Normocephalic and atraumatic.  Right Ear: External ear normal.  Left Ear: External ear normal.  Nose: Nose normal.  Mouth/Throat: Oropharynx is clear and moist. No oropharyngeal exudate.  Eyes: Pupils are equal, round, and reactive to light. Conjunctivae and EOM are normal. No scleral icterus.  Neck: Normal range of motion. Neck supple. No JVD present. No thyromegaly present.  Cardiovascular: Normal rate, regular rhythm, normal heart sounds and intact distal pulses. Exam reveals no gallop and no friction rub.  No murmur heard. Pulmonary/Chest: Effort normal and breath sounds normal. No respiratory distress. She has no wheezes. She has no rales. She exhibits no tenderness.  Abdominal: Soft. Bowel sounds are normal. She exhibits no distension and no mass. There is no tenderness. There is no rebound and no  guarding.  Musculoskeletal: Normal range of motion. She exhibits no edema or tenderness.  Lymphadenopathy:    She has no cervical adenopathy.  Neurological: She is alert and oriented to person, place, and time. She has normal reflexes. She displays normal reflexes. No cranial nerve deficit. She exhibits normal muscle tone. Coordination normal.  Skin: Skin is warm and dry. No rash noted. No erythema.  Psychiatric: She has a normal mood and affect. Her behavior is normal. Judgment and thought content normal.          Assessment & Plan:  For her depression, we will stop Celexa and try Cymbalta 30 mg daily. Recheck in 4 weeks. Get fasting labs today to check her thyroid levels, lipids, etc.  Brandy Penna, MD

## 2018-01-08 DIAGNOSIS — F418 Other specified anxiety disorders: Secondary | ICD-10-CM | POA: Insufficient documentation

## 2018-01-08 LAB — LIPID PANEL
Cholesterol: 299 mg/dL — ABNORMAL HIGH (ref 0–200)
HDL: 46.6 mg/dL (ref >39.00)
LDL CALC: 220 mg/dL — AB (ref 0–99)
NONHDL: 251.92
Total CHOL/HDL Ratio: 6
Triglycerides: 162 mg/dL — ABNORMAL HIGH (ref 0.0–149.0)
VLDL: 32.4 mg/dL (ref 0.0–40.0)

## 2018-01-08 LAB — CBC WITH DIFFERENTIAL/PLATELET
BASOS PCT: 1.1 % (ref 0.0–3.0)
Basophils Absolute: 0.1 10*3/uL (ref 0.0–0.1)
EOS ABS: 0.4 10*3/uL (ref 0.0–0.7)
EOS PCT: 6.8 % — AB (ref 0.0–5.0)
HEMATOCRIT: 42.6 % (ref 36.0–46.0)
HEMOGLOBIN: 14.8 g/dL (ref 12.0–15.0)
LYMPHS PCT: 37.9 % (ref 12.0–46.0)
Lymphs Abs: 2.2 10*3/uL (ref 0.7–4.0)
MCHC: 34.6 g/dL (ref 30.0–36.0)
MCV: 93.1 fl (ref 78.0–100.0)
MONOS PCT: 8.1 % (ref 3.0–12.0)
Monocytes Absolute: 0.5 10*3/uL (ref 0.1–1.0)
Neutro Abs: 2.7 10*3/uL (ref 1.4–7.7)
Neutrophils Relative %: 46.1 % (ref 43.0–77.0)
Platelets: 252 10*3/uL (ref 150.0–400.0)
RBC: 4.57 Mil/uL (ref 3.87–5.11)
RDW: 13.4 % (ref 11.5–15.5)
WBC: 5.8 10*3/uL (ref 4.0–10.5)

## 2018-01-08 LAB — HEPATIC FUNCTION PANEL
ALBUMIN: 4.4 g/dL (ref 3.5–5.2)
ALK PHOS: 46 U/L (ref 39–117)
ALT: 14 U/L (ref 0–35)
AST: 20 U/L (ref 0–37)
BILIRUBIN DIRECT: 0.1 mg/dL (ref 0.0–0.3)
TOTAL PROTEIN: 7 g/dL (ref 6.0–8.3)
Total Bilirubin: 0.7 mg/dL (ref 0.2–1.2)

## 2018-01-08 LAB — BASIC METABOLIC PANEL
BUN: 10 mg/dL (ref 6–23)
CALCIUM: 10.1 mg/dL (ref 8.4–10.5)
CO2: 30 meq/L (ref 19–32)
Chloride: 101 mEq/L (ref 96–112)
Creatinine, Ser: 0.84 mg/dL (ref 0.40–1.20)
GFR: 71.83 mL/min (ref >60.00)
Glucose, Bld: 88 mg/dL (ref 70–99)
Potassium: 4.5 mEq/L (ref 3.5–5.1)
SODIUM: 138 meq/L (ref 135–145)

## 2018-01-08 LAB — T3, FREE: T3, Free: 3.4 pg/mL (ref 2.3–4.2)

## 2018-01-08 LAB — T4, FREE: FREE T4: 1.11 ng/dL (ref 0.60–1.60)

## 2018-01-08 LAB — TSH: TSH: 0.49 u[IU]/mL (ref 0.35–4.50)

## 2018-01-08 MED ORDER — TEMAZEPAM 30 MG PO CAPS: 30.0000 mg | ORAL_CAPSULE | Freq: Every evening | ORAL | 1 refills | Status: DC | PRN

## 2018-01-08 MED ORDER — CLONAZEPAM 1 MG PO TABS: 1.0000 mg | ORAL_TABLET | Freq: Three times a day (TID) | ORAL | 1 refills | Status: DC | PRN

## 2018-01-14 ENCOUNTER — Other Ambulatory Visit: Payer: Self-pay

## 2018-01-14 DIAGNOSIS — Z01419 Encounter for gynecological examination (general) (routine) without abnormal findings: Secondary | ICD-10-CM | POA: Diagnosis not present

## 2018-01-14 DIAGNOSIS — Z1231 Encounter for screening mammogram for malignant neoplasm of breast: Secondary | ICD-10-CM | POA: Diagnosis not present

## 2018-01-14 DIAGNOSIS — D259 Leiomyoma of uterus, unspecified: Secondary | ICD-10-CM | POA: Diagnosis not present

## 2018-01-14 MED ORDER — ROSUVASTATIN CALCIUM 10 MG PO TABS: 10.0000 mg | ORAL_TABLET | Freq: Every day | ORAL | 3 refills | Status: DC

## 2018-01-14 NOTE — Telephone Encounter (Signed)
Call in Crestor 10 mg daily, #90 with 3 rf. Recheck labs in 90 days

## 2018-01-15 ENCOUNTER — Telehealth: Payer: Self-pay | Admitting: Family Medicine

## 2018-01-15 NOTE — Telephone Encounter (Signed)
Patient called, left VM to return call to the office to discuss her cholesterol.

## 2018-01-15 NOTE — Telephone Encounter (Signed)
Copied from [place] [phone]. Topic: Quick Communication - Lab Results >> Jan 15, 2018  4:23 PM Brandy Boyle, NT wrote: Patient would like a return call to discuss her cholesterol results please call her at [phone]

## 2018-01-21 DIAGNOSIS — H52202 Unspecified astigmatism, left eye: Secondary | ICD-10-CM | POA: Diagnosis not present

## 2018-01-21 DIAGNOSIS — H524 Presbyopia: Secondary | ICD-10-CM | POA: Diagnosis not present

## 2018-01-21 DIAGNOSIS — H5203 Hypermetropia, bilateral: Secondary | ICD-10-CM | POA: Diagnosis not present

## 2018-02-03 ENCOUNTER — Telehealth: Payer: Self-pay | Admitting: Family Medicine

## 2018-02-03 NOTE — Telephone Encounter (Signed)
Copied from [place] [id number]. Topic: Quick Communication - See Telephone Encounter >> Feb 03, 2018 12:29 PM Blase Mess A wrote: CRM for notification. See Telephone encounter for: 02/03/18.  Patient would like some advice on how to start using DULoxetine (CYMBALTA) 30 MG capsule [[id number]]  and she wants to do it correctly.  Please advise Patient [phone]

## 2018-02-04 NOTE — Telephone Encounter (Signed)
Start the Cymbalta as directed. Also take 1/2 tablet of Celexa for one week and then stop it

## 2018-02-04 NOTE — Telephone Encounter (Signed)
How to cut back off Celexa she has been on this medication for 18 years and has decide to start the Cymbalta. She wanted to know how she should cut back on the Celexa and introduce the Cymbalta?   Please advise

## 2018-02-05 NOTE — Telephone Encounter (Signed)
Called and spoke with pt. Pt advised and voiced understanding.  

## 2018-02-05 NOTE — Telephone Encounter (Signed)
Called pt and left a VM to call back. CRM created and sent to [facility] pool. 

## 2018-02-05 NOTE — Telephone Encounter (Signed)
Attempted to contact pt in order to relay instruction per Dr Sarajane Jews; left message for pt to call office on voicemail [phone].

## 2018-04-04 ENCOUNTER — Encounter: Payer: Self-pay | Admitting: Family Medicine

## 2018-04-04 ENCOUNTER — Ambulatory Visit (INDEPENDENT_AMBULATORY_CARE_PROVIDER_SITE_OTHER): Payer: Medicare HMO | Admitting: Family Medicine

## 2018-04-04 VITALS — BP 120/82 | HR 89 | Temp 98.3°F | Wt 136.4 lb

## 2018-04-04 DIAGNOSIS — J069 Acute upper respiratory infection, unspecified: Secondary | ICD-10-CM | POA: Diagnosis not present

## 2018-04-04 DIAGNOSIS — E782 Mixed hyperlipidemia: Secondary | ICD-10-CM | POA: Diagnosis not present

## 2018-04-04 DIAGNOSIS — F418 Other specified anxiety disorders: Secondary | ICD-10-CM | POA: Diagnosis not present

## 2018-04-04 DIAGNOSIS — S8000XA Contusion of unspecified knee, initial encounter: Secondary | ICD-10-CM | POA: Diagnosis not present

## 2018-04-04 MED ORDER — DULOXETINE HCL 30 MG PO CPEP: 30.0000 mg | ORAL_CAPSULE | Freq: Every day | ORAL | 3 refills | Status: DC

## 2018-04-04 NOTE — Progress Notes (Signed)
   Subjective:    Patient ID: Brandy Boyle, female    DOB: 06/21/1950, 67 y.o.   MRN: [id number]  HPI Here for several issues. First she fell off a bed in September and injured the right knee. It was painful and swollen just below th kneecap. It is much better now. Then in October she tripped on a walkway and fell, injuring the left knee. This was swollen and painful in the anterior knee as well. It is back to normal now. She asks me to check her lungs because she started coughing 2 weeks ago. This has been dry but her chest was congested. No fever. She is actually feeling much better today. Lastly she started on Cymbalta a few months ago for depression and she has been pleased with the results. She is happier now and in fact she is dating someone again.    Review of Systems  Constitutional: Negative.   HENT: Negative.   Eyes: Negative.   Respiratory: Positive for cough. Negative for shortness of breath and wheezing.   Cardiovascular: Negative.   Musculoskeletal: Positive for arthralgias.  Psychiatric/Behavioral: Negative.        Objective:   Physical Exam  Constitutional: She appears well-developed and well-nourished.  HENT:  Right Ear: External ear normal.  Left Ear: External ear normal.  Nose: Nose normal.  Mouth/Throat: Oropharynx is clear and moist.  Eyes: Conjunctivae are normal.  Neck: No thyromegaly present.  Pulmonary/Chest: Effort normal and breath sounds normal. No stridor. No respiratory distress. She has no wheezes. She has no rales.  Musculoskeletal:  The left knee is normal. The right knee is mildly tender over the tibial tubercle. No swelling full ROM    Lymphadenopathy:    She has no cervical adenopathy.  Psychiatric: She has a normal mood and affect. Her behavior is normal. Thought content normal.          Assessment & Plan:  She had contusions to both knees and they are healing as expected. She had a viral URI which is clearing as expected. Her  depression with anxiety has improved and we agreed she will stay on Cymbalta for now.  Alysia Penna, MD

## 2018-04-09 ENCOUNTER — Other Ambulatory Visit (INDEPENDENT_AMBULATORY_CARE_PROVIDER_SITE_OTHER): Payer: Medicare HMO

## 2018-04-09 DIAGNOSIS — E782 Mixed hyperlipidemia: Secondary | ICD-10-CM

## 2018-04-09 LAB — LIPID PANEL
Cholesterol: 265 mg/dL — ABNORMAL HIGH (ref 0–200)
HDL: 48.5 mg/dL (ref >39.00)
LDL Cholesterol: 195 mg/dL — ABNORMAL HIGH (ref 0–99)
NONHDL: 216.43
Total CHOL/HDL Ratio: 5
Triglycerides: 105 mg/dL (ref 0.0–149.0)
VLDL: 21 mg/dL (ref 0.0–40.0)

## 2018-04-15 ENCOUNTER — Telehealth: Payer: Self-pay | Admitting: *Deleted

## 2018-04-15 MED ORDER — ATORVASTATIN CALCIUM 20 MG PO TABS: 20.0000 mg | ORAL_TABLET | Freq: Every day | ORAL | 3 refills | Status: DC

## 2018-04-15 NOTE — Telephone Encounter (Signed)
Called and spoke with pt and she is aware of lab results.  meds have been sent to [facility] and pt is aware.  She will call the office in 90 days to set the lab appointment up once she starts the medication.

## 2018-04-15 NOTE — Telephone Encounter (Signed)
-----   Message from Laurey Morale, MD sent at 04/15/2018  7:26 AM EST ----- Cholesterol is very high. Start on Lipitor 20 mg daily. Call in #90 with 3 rf and recheck in 90 days

## 2018-04-28 DIAGNOSIS — R51 Headache: Secondary | ICD-10-CM | POA: Diagnosis not present

## 2018-05-16 ENCOUNTER — Telehealth: Payer: Self-pay | Admitting: Family Medicine

## 2018-05-16 NOTE — Telephone Encounter (Signed)
That's okay. Stop the Lipitor and call in Crestor 10 mg to take daily, #90 with 3 rf

## 2018-05-16 NOTE — Telephone Encounter (Signed)
Please advise 

## 2018-05-16 NOTE — Telephone Encounter (Signed)
Copied from [place] [phone]. Topic: Quick Communication - See Telephone Encounter >> May 16, 2018 11:25 AM Blase Mess A wrote: CRM for notification. See Telephone encounter for: 05/16/18.  Patient is calling is calling because she is having headaches.  She believes that it is coming from the Lipitor. At this time she is wanting to ask Dr. Sarajane Jews if her medication can be changed to Crestor.  Patient declined appt today at 2:45pm. She stated if her headache got worse. She would come in for appt.

## 2018-05-19 NOTE — Telephone Encounter (Signed)
lmomtcb x1 

## 2018-05-23 MED ORDER — ROSUVASTATIN CALCIUM 10 MG PO TABS: 10.0000 mg | ORAL_TABLET | Freq: Every day | ORAL | 3 refills | Status: DC

## 2018-05-23 NOTE — Telephone Encounter (Signed)
error 

## 2018-05-23 NOTE — Telephone Encounter (Signed)
I have sent the crestor to the pharmacy for the pt.

## 2018-06-06 ENCOUNTER — Other Ambulatory Visit: Payer: Self-pay | Admitting: Family Medicine

## 2018-06-06 MED ORDER — ROSUVASTATIN CALCIUM 10 MG PO TABS: 10.0000 mg | ORAL_TABLET | Freq: Every day | ORAL | 3 refills | Status: DC

## 2018-06-11 DIAGNOSIS — R69 Illness, unspecified: Secondary | ICD-10-CM | POA: Diagnosis not present

## 2018-07-03 ENCOUNTER — Ambulatory Visit (INDEPENDENT_AMBULATORY_CARE_PROVIDER_SITE_OTHER): Payer: Medicare HMO | Admitting: Family Medicine

## 2018-07-03 ENCOUNTER — Encounter: Payer: Self-pay | Admitting: Family Medicine

## 2018-07-03 ENCOUNTER — Ambulatory Visit: Payer: Medicare HMO | Admitting: Family Medicine

## 2018-07-03 VITALS — BP 140/88 | HR 95 | Temp 98.7°F | Wt 139.0 lb

## 2018-07-03 DIAGNOSIS — R300 Dysuria: Secondary | ICD-10-CM

## 2018-07-03 DIAGNOSIS — N390 Urinary tract infection, site not specified: Secondary | ICD-10-CM

## 2018-07-03 LAB — POCT URINALYSIS DIPSTICK
Bilirubin, UA: NEGATIVE
GLUCOSE UA: NEGATIVE
Ketones, UA: NEGATIVE
Nitrite, UA: POSITIVE
Protein, UA: POSITIVE — AB
Spec Grav, UA: 1.01 (ref 1.010–1.025)
Urobilinogen, UA: 0.2 E.U./dL
pH, UA: 6 (ref 5.0–8.0)

## 2018-07-03 MED ORDER — ONDANSETRON HCL 8 MG PO TABS: 8.0000 mg | ORAL_TABLET | Freq: Three times a day (TID) | ORAL | 2 refills | Status: DC | PRN

## 2018-07-03 MED ORDER — PROCHLORPERAZINE 25 MG RE SUPP: 25.0000 mg | Freq: Three times a day (TID) | RECTAL | 2 refills | Status: DC | PRN

## 2018-07-03 MED ORDER — CIPROFLOXACIN HCL 500 MG PO TABS: 500.0000 mg | ORAL_TABLET | Freq: Two times a day (BID) | ORAL | 0 refills | Status: DC

## 2018-07-03 MED ORDER — PHENAZOPYRIDINE HCL 200 MG PO TABS: 200.0000 mg | ORAL_TABLET | Freq: Three times a day (TID) | ORAL | 1 refills | Status: DC | PRN

## 2018-07-03 NOTE — Addendum Note (Signed)
Addended by: Gwynne Edinger on: 07/03/2018 11:57 AM   Modules accepted: Orders

## 2018-07-03 NOTE — Progress Notes (Signed)
   Subjective:    Patient ID: Brandy Boyle, female    DOB: 1951/05/09, 68 y.o.   MRN: [id number]  HPI Here for 2 days of urinary burning and urgency. No fever or back pain or nausea. Drinking water.    Review of Systems  Constitutional: Negative.   Respiratory: Negative.   Cardiovascular: Negative.   Genitourinary: Positive for dysuria, frequency and urgency. Negative for hematuria.       Objective:   Physical Exam Constitutional:      Appearance: Normal appearance.  Cardiovascular:     Rate and Rhythm: Normal rate and regular rhythm.     Pulses: Normal pulses.     Heart sounds: Normal heart sounds.  Pulmonary:     Effort: Pulmonary effort is normal.     Breath sounds: Normal breath sounds.  Abdominal:     General: Abdomen is flat. Bowel sounds are normal. There is no distension.     Palpations: Abdomen is soft. There is no mass.     Tenderness: There is no abdominal tenderness. There is no right CVA tenderness, left CVA tenderness, guarding or rebound.     Hernia: No hernia is present.  Neurological:     Mental Status: She is alert.           Assessment & Plan:  UTI, treat with Cipro. Add Pyridium as needed. Culture the sample.  Alysia Penna, MD

## 2018-07-05 LAB — URINE CULTURE
MICRO NUMBER:: 161225
SPECIMEN QUALITY: ADEQUATE

## 2018-11-10 ENCOUNTER — Ambulatory Visit: Payer: Self-pay

## 2018-11-10 NOTE — Telephone Encounter (Signed)
Pt called stating that she is taking a trip with her sisters. She was curious as to the recommendations for travel. NT reviewed social distancing hand hygiene and wearing mask with patient. Pt was advised to contact the airlines for their recommendations. Pt will follow [facility] health department recommendations. Pt was advised to stay home if she developed any symptoms. Pt verbalized understanding. Reason for Disposition . Health Information question, no triage required and triager able to answer question  Answer Assessment - Initial Assessment Questions 1. REASON FOR CALL or QUESTION: "What is your reason for calling today?" or "How can I best help you?" or "What question do you have that I can help answer?"     Any recommendations for travel.  Protocols used: INFORMATION ONLY CALL-A-AH

## 2018-11-19 DIAGNOSIS — R69 Illness, unspecified: Secondary | ICD-10-CM | POA: Diagnosis not present

## 2018-12-05 ENCOUNTER — Other Ambulatory Visit: Payer: Self-pay | Admitting: Family Medicine

## 2019-01-14 ENCOUNTER — Other Ambulatory Visit: Payer: Self-pay

## 2019-01-14 MED ORDER — CLONAZEPAM 1 MG PO TABS: 1.0000 mg | ORAL_TABLET | Freq: Three times a day (TID) | ORAL | 1 refills | Status: DC | PRN

## 2019-01-14 NOTE — Telephone Encounter (Signed)
Last filled 01/08/2018 Last OV 07/03/2018  Ok to fill?

## 2019-01-27 ENCOUNTER — Other Ambulatory Visit: Payer: Self-pay

## 2019-01-27 ENCOUNTER — Encounter: Payer: Self-pay | Admitting: Family Medicine

## 2019-01-27 ENCOUNTER — Ambulatory Visit (INDEPENDENT_AMBULATORY_CARE_PROVIDER_SITE_OTHER): Payer: Medicare HMO | Admitting: Family Medicine

## 2019-01-27 ENCOUNTER — Ambulatory Visit (INDEPENDENT_AMBULATORY_CARE_PROVIDER_SITE_OTHER)
Admission: RE | Admit: 2019-01-27 | Discharge: 2019-01-27 | Disposition: A | Discharge status: Home / Self Care | Payer: Medicare HMO | Source: Ambulatory Visit | Attending: Family Medicine | Admitting: Family Medicine

## 2019-01-27 VITALS — BP 120/80 | HR 79 | Temp 96.7°F | Wt 135.2 lb

## 2019-01-27 DIAGNOSIS — H524 Presbyopia: Secondary | ICD-10-CM | POA: Diagnosis not present

## 2019-01-27 DIAGNOSIS — G8929 Other chronic pain: Secondary | ICD-10-CM | POA: Diagnosis not present

## 2019-01-27 DIAGNOSIS — H2513 Age-related nuclear cataract, bilateral: Secondary | ICD-10-CM | POA: Diagnosis not present

## 2019-01-27 DIAGNOSIS — Z23 Encounter for immunization: Secondary | ICD-10-CM

## 2019-01-27 DIAGNOSIS — M25561 Pain in right knee: Secondary | ICD-10-CM

## 2019-01-27 DIAGNOSIS — H5203 Hypermetropia, bilateral: Secondary | ICD-10-CM | POA: Diagnosis not present

## 2019-01-27 DIAGNOSIS — H52202 Unspecified astigmatism, left eye: Secondary | ICD-10-CM | POA: Diagnosis not present

## 2019-01-27 DIAGNOSIS — Z0000 Encounter for general adult medical examination without abnormal findings: Secondary | ICD-10-CM

## 2019-01-27 DIAGNOSIS — E039 Hypothyroidism, unspecified: Secondary | ICD-10-CM

## 2019-01-27 DIAGNOSIS — E010 Iodine-deficiency related diffuse (endemic) goiter: Secondary | ICD-10-CM | POA: Diagnosis not present

## 2019-01-27 LAB — CBC WITH DIFFERENTIAL/PLATELET
Basophils Absolute: 0.1 10*3/uL (ref 0.0–0.1)
Basophils Relative: 1.5 % (ref 0.0–3.0)
Eosinophils Absolute: 0.4 10*3/uL (ref 0.0–0.7)
Eosinophils Relative: 8.3 % — ABNORMAL HIGH (ref 0.0–5.0)
HCT: 44.5 % (ref 36.0–46.0)
Hemoglobin: 15.2 g/dL — ABNORMAL HIGH (ref 12.0–15.0)
Lymphocytes Relative: 36.3 % (ref 12.0–46.0)
Lymphs Abs: 1.9 10*3/uL (ref 0.7–4.0)
MCHC: 34.1 g/dL (ref 30.0–36.0)
MCV: 94.1 fl (ref 78.0–100.0)
Monocytes Absolute: 0.5 10*3/uL (ref 0.1–1.0)
Monocytes Relative: 9 % (ref 3.0–12.0)
Neutro Abs: 2.3 10*3/uL (ref 1.4–7.7)
Neutrophils Relative %: 44.9 % (ref 43.0–77.0)
Platelets: 216 10*3/uL (ref 150.0–400.0)
RBC: 4.73 Mil/uL (ref 3.87–5.11)
RDW: 13.3 % (ref 11.5–15.5)
WBC: 5.2 10*3/uL (ref 4.0–10.5)

## 2019-01-27 LAB — HEPATIC FUNCTION PANEL
ALT: 17 U/L (ref 0–35)
AST: 21 U/L (ref 0–37)
Albumin: 4.3 g/dL (ref 3.5–5.2)
Alkaline Phosphatase: 47 U/L (ref 39–117)
Bilirubin, Direct: 0.1 mg/dL (ref 0.0–0.3)
Total Bilirubin: 0.6 mg/dL (ref 0.2–1.2)
Total Protein: 6.8 g/dL (ref 6.0–8.3)

## 2019-01-27 LAB — BASIC METABOLIC PANEL
BUN: 11 mg/dL (ref 6–23)
CO2: 33 mEq/L — ABNORMAL HIGH (ref 19–32)
Calcium: 9.4 mg/dL (ref 8.4–10.5)
Chloride: 102 mEq/L (ref 96–112)
Creatinine, Ser: 0.8 mg/dL (ref 0.40–1.20)
GFR: 71.27 mL/min (ref >60.00)
Glucose, Bld: 85 mg/dL (ref 70–99)
Potassium: 4 mEq/L (ref 3.5–5.1)
Sodium: 142 mEq/L (ref 135–145)

## 2019-01-27 LAB — POC URINALSYSI DIPSTICK (AUTOMATED)
Blood, UA: NEGATIVE
Glucose, UA: NEGATIVE
Ketones, UA: NEGATIVE
Nitrite, UA: NEGATIVE
Protein, UA: POSITIVE — AB
Spec Grav, UA: 1.025 (ref 1.010–1.025)
Urobilinogen, UA: 0.2 E.U./dL
pH, UA: 6 (ref 5.0–8.0)

## 2019-01-27 LAB — T4, FREE: Free T4: 0.7 ng/dL (ref 0.60–1.60)

## 2019-01-27 LAB — LIPID PANEL
Cholesterol: 173 mg/dL (ref 0–200)
HDL: 48.7 mg/dL (ref >39.00)
LDL Cholesterol: 86 mg/dL (ref 0–99)
NonHDL: 124.07
Total CHOL/HDL Ratio: 4
Triglycerides: 190 mg/dL — ABNORMAL HIGH (ref 0.0–149.0)
VLDL: 38 mg/dL (ref 0.0–40.0)

## 2019-01-27 LAB — TSH: TSH: 0.38 u[IU]/mL (ref 0.35–4.50)

## 2019-01-27 LAB — T3, FREE: T3, Free: 3.2 pg/mL (ref 2.3–4.2)

## 2019-01-27 MED ORDER — LEVOTHYROXINE SODIUM 50 MCG PO TABS: 50.0000 ug | ORAL_TABLET | Freq: Every day | ORAL | 3 refills | Status: DC

## 2019-01-27 NOTE — Progress Notes (Signed)
Subjective:    Patient ID: Brandy Boyle, female    DOB: June 24, 1950, 68 y.o.   MRN: [id number]  HPI Here for a well exam. She has a few issues to discuss. First she has had intermittent pain in the right knee for the past year. The pain is located at the top of the patella. No swelling. This started after she fell on some gravel. She also has some pain in the left lateral hip area when she lies on her left side in bed. This does not hurt to walk. She takes Tylenol as needed. Also she thinks the right half of her thyroid gland is a little larger than the left side. There is no discomfort.    Review of Systems  Constitutional: Negative.   HENT: Negative.   Eyes: Negative.   Respiratory: Negative.   Cardiovascular: Negative.   Gastrointestinal: Negative.   Genitourinary: Negative for decreased urine volume, difficulty urinating, dyspareunia, dysuria, enuresis, flank pain, frequency, hematuria, pelvic pain and urgency.  Musculoskeletal: Positive for arthralgias.  Skin: Negative.   Neurological: Negative.   Psychiatric/Behavioral: Negative.        Objective:   Physical Exam Constitutional:      General: She is not in acute distress.    Appearance: She is well-developed.  HENT:     Head: Normocephalic and atraumatic.     Right Ear: External ear normal.     Left Ear: External ear normal.     Nose: Nose normal.     Mouth/Throat:     Pharynx: No oropharyngeal exudate.  Eyes:     General: No scleral icterus.    Conjunctiva/sclera: Conjunctivae normal.     Pupils: Pupils are equal, round, and reactive to light.  Neck:     Musculoskeletal: Normal range of motion and neck supple.     Thyroid: No thyromegaly.     Vascular: No JVD.     Comments: The right thyroid lobe is slightly more pronounced than the left. It is not nodular or tender.  Cardiovascular:     Rate and Rhythm: Normal rate and regular rhythm.     Heart sounds: Normal heart sounds. No murmur. No friction rub. No  gallop.   Pulmonary:     Effort: Pulmonary effort is normal. No respiratory distress.     Breath sounds: Normal breath sounds. No wheezing or rales.  Chest:     Chest wall: No tenderness.  Abdominal:     General: Bowel sounds are normal. There is no distension.     Palpations: Abdomen is soft. There is no mass.     Tenderness: There is no abdominal tenderness. There is no guarding or rebound.  Musculoskeletal: Normal range of motion.        General: No tenderness.     Comments: The right knee is normal on exam, no tenderness and full ROM. The left hip has full ROM but she is tender over the greater trochanter   Lymphadenopathy:     Cervical: No cervical adenopathy.  Skin:    General: Skin is warm and dry.     Findings: No erythema or rash.  Neurological:     Mental Status: She is alert and oriented to person, place, and time.     Cranial Nerves: No cranial nerve deficit.     Motor: No abnormal muscle tone.     Coordination: Coordination normal.     Deep Tendon Reflexes: Reflexes are normal and symmetric. Reflexes normal.  Psychiatric:  Behavior: Behavior normal.        Thought Content: Thought content normal.        Judgment: Judgment normal.           Assessment & Plan:  Well exam. We discussed diet and exercise. Get fasting labs. She has some slight thyroid enlargement so we will check a thyroid panel today and set her up for an [place] soon. She has greater trochanteric bursitis. Use Tylenol and ice prn. The right knee pain is most consistent with a quadriceps tendonitis. Again she can use ice and Tylenol prn. Get Xrays of the knee.  Alysia Penna, MD

## 2019-02-04 ENCOUNTER — Ambulatory Visit
Admission: RE | Admit: 2019-02-04 | Discharge: 2019-02-04 | Disposition: A | Discharge status: Home / Self Care | Payer: Medicare HMO | Source: Ambulatory Visit | Attending: Family Medicine | Admitting: Family Medicine

## 2019-02-04 DIAGNOSIS — E010 Iodine-deficiency related diffuse (endemic) goiter: Secondary | ICD-10-CM

## 2019-02-04 DIAGNOSIS — E039 Hypothyroidism, unspecified: Secondary | ICD-10-CM

## 2019-02-04 DIAGNOSIS — E042 Nontoxic multinodular goiter: Secondary | ICD-10-CM | POA: Diagnosis not present

## 2019-02-10 ENCOUNTER — Telehealth: Payer: Self-pay | Admitting: Family Medicine

## 2019-02-10 NOTE — Telephone Encounter (Signed)
Returned call to patient who had called for lab results. Left VM to return call to office.

## 2019-02-20 ENCOUNTER — Telehealth: Payer: Self-pay

## 2019-02-20 ENCOUNTER — Other Ambulatory Visit: Payer: Self-pay

## 2019-02-20 MED ORDER — TRAMADOL HCL 50 MG PO TABS: 50.0000 mg | ORAL_TABLET | Freq: Four times a day (QID) | ORAL | 0 refills | Status: DC | PRN

## 2019-02-20 MED ORDER — TEMAZEPAM 30 MG PO CAPS: 30.0000 mg | ORAL_CAPSULE | Freq: Every evening | ORAL | 1 refills | Status: DC | PRN

## 2019-02-20 NOTE — Telephone Encounter (Signed)
Done

## 2019-02-20 NOTE — Telephone Encounter (Signed)
Fax received for refill request for Tramadol. Sending to PCP for refill.

## 2019-03-02 ENCOUNTER — Telehealth: Payer: Self-pay | Admitting: Family Medicine

## 2019-03-02 NOTE — Telephone Encounter (Signed)
Called pt again no answer

## 2019-03-02 NOTE — Telephone Encounter (Signed)
rx refill clonazePAM (KLONOPIN) 1 MG tablet  temazepam (RESTORIL) 30 MG capsule    [facility]  [facility] - [place], [place] [phone] (Phone) [phone] (Fax)

## 2019-03-02 NOTE — Telephone Encounter (Signed)
Called pt no answer. Pt already has a refill on the clonazepam. Routing to PCP for refill of temazepam.

## 2019-03-03 NOTE — Telephone Encounter (Signed)
No, she has refills available for both of these

## 2019-03-09 ENCOUNTER — Telehealth: Payer: Self-pay

## 2019-03-09 NOTE — Telephone Encounter (Signed)
Pt called in and stated that she does not have refills on this med at [facility].  [facility] not use [facility] only if it urgent.  She stated she uses this as needed.  Best number [phone] [facility] stated they do not have any on file

## 2019-03-09 NOTE — Telephone Encounter (Signed)
Copied from [place] [phone]. Topic: General - Inquiry >> Mar 02, 2019  1:19 PM Mathis Bud wrote: Reason for CRM: patient is requesting a call back regarding her traMADol (ULTRAM) 50 MG tablet.  Patient has not taken this medication and would like nurse to call to go over medication.  Call back [phone]

## 2019-03-09 NOTE — Telephone Encounter (Signed)
Donnald Garre been trying to contact pt that she does not have to take Rx. We received a note from the pharmacy stating pt wanted this refilled. Pt has not answered again.

## 2019-03-12 ENCOUNTER — Other Ambulatory Visit: Payer: Self-pay

## 2019-03-12 DIAGNOSIS — Z20822 Contact with and (suspected) exposure to covid-19: Secondary | ICD-10-CM

## 2019-03-12 DIAGNOSIS — Z20828 Contact with and (suspected) exposure to other viral communicable diseases: Secondary | ICD-10-CM | POA: Diagnosis not present

## 2019-03-12 MED ORDER — TEMAZEPAM 30 MG PO CAPS: 30.0000 mg | ORAL_CAPSULE | Freq: Every evening | ORAL | 5 refills | Status: DC | PRN

## 2019-03-12 MED ORDER — CLONAZEPAM 1 MG PO TABS: 1.0000 mg | ORAL_TABLET | Freq: Three times a day (TID) | ORAL | 2 refills | Status: DC | PRN

## 2019-03-12 NOTE — Addendum Note (Signed)
Addended by: Gwenyth Ober R on: 03/12/2019 12:34 PM   Modules accepted: Orders

## 2019-03-12 NOTE — Telephone Encounter (Signed)
[facility] and they stated they have not received a refill for Clonazepam that it wen to [facility]. They also stated they have not received a refill for Temazepam which look like it has been sent to [facility]. Gave verbal orders over the phone for medication. Pharmacist stated that they could not refill with the Qty. provided for both medication due to being a control.    Clonazepam sent for 90 with 2 rf  Temazepam 30 with 5 refills

## 2019-03-12 NOTE — Telephone Encounter (Signed)
FYI

## 2019-03-14 LAB — NOVEL CORONAVIRUS, NAA: SARS-CoV-2, NAA: NOT DETECTED

## 2019-03-14 LAB — SPECIMEN STATUS REPORT

## 2019-04-01 DIAGNOSIS — R32 Unspecified urinary incontinence: Secondary | ICD-10-CM | POA: Diagnosis not present

## 2019-04-01 DIAGNOSIS — N952 Postmenopausal atrophic vaginitis: Secondary | ICD-10-CM | POA: Diagnosis not present

## 2019-04-21 DIAGNOSIS — Z1231 Encounter for screening mammogram for malignant neoplasm of breast: Secondary | ICD-10-CM | POA: Diagnosis not present

## 2019-04-21 DIAGNOSIS — Z01419 Encounter for gynecological examination (general) (routine) without abnormal findings: Secondary | ICD-10-CM | POA: Diagnosis not present

## 2019-04-21 DIAGNOSIS — D251 Intramural leiomyoma of uterus: Secondary | ICD-10-CM | POA: Diagnosis not present

## 2019-05-11 ENCOUNTER — Telehealth: Payer: Self-pay | Admitting: Family Medicine

## 2019-05-11 ENCOUNTER — Other Ambulatory Visit: Payer: Self-pay | Admitting: *Deleted

## 2019-05-11 MED ORDER — DULOXETINE HCL 30 MG PO CPEP: 30.0000 mg | ORAL_CAPSULE | Freq: Every day | ORAL | 3 refills | Status: DC

## 2019-05-11 NOTE — Telephone Encounter (Signed)
Call in #90 with 3 rf  

## 2019-05-11 NOTE — Telephone Encounter (Signed)
Rx sent to the pharmacy.

## 2019-05-11 NOTE — Telephone Encounter (Signed)
Copied from [place] [phone]. Topic: General - Other >> May 11, 2019 12:10 PM Keene Breath wrote: Reason for CRM: Patient to ask the doctor or nurse to send in her script for DULoxetine (CYMBALTA) 30 MG capsule for 90 day supply because her pharmacy said that they could not fill with the current prescription.  Please advise and call to discuss at [phone]

## 2019-05-11 NOTE — Telephone Encounter (Signed)
Please advise 

## 2019-05-14 DIAGNOSIS — R87612 Low grade squamous intraepithelial lesion on cytologic smear of cervix (LGSIL): Secondary | ICD-10-CM | POA: Diagnosis not present

## 2019-05-16 ENCOUNTER — Other Ambulatory Visit: Payer: Self-pay | Admitting: Family Medicine

## 2019-05-18 ENCOUNTER — Ambulatory Visit: Payer: Medicare HMO | Admitting: Family Medicine

## 2019-05-18 ENCOUNTER — Telehealth: Payer: Self-pay

## 2019-05-18 NOTE — Telephone Encounter (Signed)
I do not see an appointment scheduled for this patient. Nothing further needed.

## 2019-05-18 NOTE — Telephone Encounter (Signed)
Copied from [place] [phone]. Topic: General - Inquiry >> May 18, 2019  1:56 PM Brandy Boyle wrote: Reason for CRM: Patient needed to cancel her appt for today but would not like to be charged due to the fact shes been trying to cancel since Friday

## 2019-06-18 ENCOUNTER — Ambulatory Visit: Payer: Medicare HMO | Attending: Internal Medicine

## 2019-06-18 DIAGNOSIS — Z23 Encounter for immunization: Secondary | ICD-10-CM

## 2019-06-18 NOTE — Progress Notes (Signed)
   Covid-19 Vaccination Clinic  Name:  Brandy Boyle    MRN: [id number] DOB: 01-Mar-1951  06/18/2019  [name] was observed post Covid-19 immunization for 15 minutes without incidence. She was provided with Vaccine Information Sheet and instruction to access the V-Safe system.   [name] was instructed to call 911 with any severe reactions post vaccine: Marland Kitchen Difficulty breathing  . Swelling of your face and throat  . A fast heartbeat  . A bad rash all over your body  . Dizziness and weakness    Immunizations Administered    Name Date Dose VIS Date Route   Pfizer COVID-19 Vaccine 06/18/2019  2:21 PM 0.3 mL 05/08/2019 Intramuscular   Manufacturer: [place]   Lot: [id number]   [place]: [id number]

## 2019-07-09 ENCOUNTER — Ambulatory Visit: Payer: Medicare HMO | Attending: Internal Medicine

## 2019-07-09 ENCOUNTER — Telehealth: Payer: Self-pay | Admitting: Family Medicine

## 2019-07-09 DIAGNOSIS — Z23 Encounter for immunization: Secondary | ICD-10-CM

## 2019-07-09 NOTE — Telephone Encounter (Signed)
Pt is curious if they decided for sure how long someone is infected or could spread the virus after having it?   She has been around her daughter and son in law who have had it and wants to know when is she considered safe to go back to help them out with her grandchild.  They were diagnosed on 06/23/19   Pt can be reached at [phone] fry

## 2019-07-09 NOTE — Progress Notes (Signed)
   Covid-19 Vaccination Clinic  Name:  Brandy Boyle    MRN: [id number] DOB: Nov 04, 1950  07/09/2019  [name] was observed post Covid-19 immunization for 15 minutes without incidence. She was provided with Vaccine Information Sheet and instruction to access the V-Safe system.   [name] was instructed to call 911 with any severe reactions post vaccine: Marland Kitchen Difficulty breathing  . Swelling of your face and throat  . A fast heartbeat  . A bad rash all over your body  . Dizziness and weakness    Immunizations Administered    Name Date Dose VIS Date Route   Pfizer COVID-19 Vaccine 07/09/2019  3:36 PM 0.3 mL 05/08/2019 Intramuscular   Manufacturer: [employer]   Lot: [place]   [place]: [id number]

## 2019-07-10 NOTE — Telephone Encounter (Signed)
You are considered to be contagious for 10 days from the exposure

## 2020-01-14 DIAGNOSIS — Z8249 Family history of ischemic heart disease and other diseases of the circulatory system: Secondary | ICD-10-CM | POA: Diagnosis not present

## 2020-01-14 DIAGNOSIS — Z823 Family history of stroke: Secondary | ICD-10-CM | POA: Diagnosis not present

## 2020-01-14 DIAGNOSIS — R030 Elevated blood-pressure reading, without diagnosis of hypertension: Secondary | ICD-10-CM | POA: Diagnosis not present

## 2020-01-14 DIAGNOSIS — E039 Hypothyroidism, unspecified: Secondary | ICD-10-CM | POA: Diagnosis not present

## 2020-01-14 DIAGNOSIS — R69 Illness, unspecified: Secondary | ICD-10-CM | POA: Diagnosis not present

## 2020-01-14 DIAGNOSIS — Z803 Family history of malignant neoplasm of breast: Secondary | ICD-10-CM | POA: Diagnosis not present

## 2020-01-15 ENCOUNTER — Telehealth: Payer: Self-pay | Admitting: Family Medicine

## 2020-01-15 DIAGNOSIS — M810 Age-related osteoporosis without current pathological fracture: Secondary | ICD-10-CM

## 2020-01-15 NOTE — Telephone Encounter (Signed)
Pt wants a bone density test before she comes in for her physical 091/10  Please call her when one is ordered so she can schedule the appointment  [phone]  Please advise

## 2020-01-18 NOTE — Telephone Encounter (Signed)
The order was placed.

## 2020-01-18 NOTE — Telephone Encounter (Signed)
LVM for patient that order was placed

## 2020-01-18 NOTE — Addendum Note (Signed)
Addended by: Alysia Penna A on: 01/18/2020 12:26 PM   Modules accepted: Orders

## 2020-02-04 DIAGNOSIS — H5203 Hypermetropia, bilateral: Secondary | ICD-10-CM | POA: Diagnosis not present

## 2020-02-04 DIAGNOSIS — H52203 Unspecified astigmatism, bilateral: Secondary | ICD-10-CM | POA: Diagnosis not present

## 2020-02-04 DIAGNOSIS — H524 Presbyopia: Secondary | ICD-10-CM | POA: Diagnosis not present

## 2020-02-04 DIAGNOSIS — H25813 Combined forms of age-related cataract, bilateral: Secondary | ICD-10-CM | POA: Diagnosis not present

## 2020-02-05 ENCOUNTER — Ambulatory Visit (INDEPENDENT_AMBULATORY_CARE_PROVIDER_SITE_OTHER): Payer: Medicare HMO | Admitting: Family Medicine

## 2020-02-05 ENCOUNTER — Encounter: Payer: Self-pay | Admitting: Family Medicine

## 2020-02-05 ENCOUNTER — Other Ambulatory Visit: Payer: Self-pay

## 2020-02-05 VITALS — BP 112/70 | HR 85 | Temp 98.6°F | Ht 60.0 in | Wt 134.0 lb

## 2020-02-05 DIAGNOSIS — E039 Hypothyroidism, unspecified: Secondary | ICD-10-CM

## 2020-02-05 DIAGNOSIS — Z0000 Encounter for general adult medical examination without abnormal findings: Secondary | ICD-10-CM

## 2020-02-05 MED ORDER — DULOXETINE HCL 30 MG PO CPEP
30.0000 mg | ORAL_CAPSULE | Freq: Every day | ORAL | 3 refills | Status: DC
Start: 2020-02-05 — End: 2021-01-26

## 2020-02-05 MED ORDER — LEVOTHYROXINE SODIUM 50 MCG PO TABS
50.0000 ug | ORAL_TABLET | Freq: Every day | ORAL | 3 refills | Status: DC
Start: 2020-02-05 — End: 2021-03-13

## 2020-02-05 MED ORDER — CLONAZEPAM 1 MG PO TABS: 1.0000 mg | ORAL_TABLET | Freq: Three times a day (TID) | ORAL | 1 refills | Status: DC | PRN

## 2020-02-05 MED ORDER — TEMAZEPAM 30 MG PO CAPS
30.0000 mg | ORAL_CAPSULE | Freq: Every evening | ORAL | 1 refills | Status: DC | PRN
Start: 2020-02-05 — End: 2021-06-14

## 2020-02-05 NOTE — Progress Notes (Signed)
° °  Subjective:    Patient ID: Brandy Boyle, female    DOB: February 11, 1951, 69 y.o.   MRN: [id number]  HPI Here for a well exam. She feels fine.    Review of Systems  Constitutional: Negative.   HENT: Negative.   Eyes: Negative.   Respiratory: Negative.   Cardiovascular: Negative.   Gastrointestinal: Negative.   Genitourinary: Negative for decreased urine volume, difficulty urinating, dyspareunia, dysuria, enuresis, flank pain, frequency, hematuria, pelvic pain and urgency.  Musculoskeletal: Negative.   Skin: Negative.   Neurological: Negative.   Psychiatric/Behavioral: Negative.        Objective:   Physical Exam Constitutional:      General: She is not in acute distress.    Appearance: She is well-developed.  HENT:     Head: Normocephalic and atraumatic.     Right Ear: External ear normal.     Left Ear: External ear normal.     Nose: Nose normal.     Mouth/Throat:     Pharynx: No oropharyngeal exudate.  Eyes:     General: No scleral icterus.    Conjunctiva/sclera: Conjunctivae normal.     Pupils: Pupils are equal, round, and reactive to light.  Neck:     Thyroid: No thyromegaly.     Vascular: No JVD.  Cardiovascular:     Rate and Rhythm: Normal rate and regular rhythm.     Heart sounds: Normal heart sounds. No murmur heard.  No friction rub. No gallop.   Pulmonary:     Effort: Pulmonary effort is normal. No respiratory distress.     Breath sounds: Normal breath sounds. No wheezing or rales.  Chest:     Chest wall: No tenderness.  Abdominal:     General: Bowel sounds are normal. There is no distension.     Palpations: Abdomen is soft. There is no mass.     Tenderness: There is no abdominal tenderness. There is no guarding or rebound.  Musculoskeletal:        General: No tenderness. Normal range of motion.     Cervical back: Normal range of motion and neck supple.  Lymphadenopathy:     Cervical: No cervical adenopathy.  Skin:    General: Skin is warm and dry.       Findings: No erythema or rash.  Neurological:     Mental Status: She is alert and oriented to person, place, and time.     Cranial Nerves: No cranial nerve deficit.     Motor: No abnormal muscle tone.     Coordination: Coordination normal.     Deep Tendon Reflexes: Reflexes are normal and symmetric. Reflexes normal.  Psychiatric:        Behavior: Behavior normal.        Thought Content: Thought content normal.        Judgment: Judgment normal.           Assessment & Plan:  Well exam. We discussed diet and exercise. Get fasting labs.  Brandy Penna, MD

## 2020-02-06 LAB — HEPATIC FUNCTION PANEL
AG Ratio: 1.9 (calc) (ref 1.0–2.5)
ALT: 13 U/L (ref 6–29)
AST: 21 U/L (ref 10–35)
Albumin: 4.5 g/dL (ref 3.6–5.1)
Alkaline phosphatase (APISO): 47 U/L (ref 37–153)
Bilirubin, Direct: 0.1 mg/dL (ref 0.0–0.2)
Globulin: 2.4 g/dL (calc) (ref 1.9–3.7)
Indirect Bilirubin: 0.6 mg/dL (calc) (ref 0.2–1.2)
Total Bilirubin: 0.7 mg/dL (ref 0.2–1.2)
Total Protein: 6.9 g/dL (ref 6.1–8.1)

## 2020-02-06 LAB — BASIC METABOLIC PANEL
BUN: 12 mg/dL (ref 7–25)
CO2: 29 mmol/L (ref 20–32)
Calcium: 9.5 mg/dL (ref 8.6–10.4)
Chloride: 103 mmol/L (ref 98–110)
Creat: 0.78 mg/dL (ref 0.50–0.99)
Glucose, Bld: 92 mg/dL (ref 65–99)
Potassium: 4.1 mmol/L (ref 3.5–5.3)
Sodium: 141 mmol/L (ref 135–146)

## 2020-02-06 LAB — LIPID PANEL
Cholesterol: 279 mg/dL — ABNORMAL HIGH (ref <200)
HDL: 45 mg/dL — ABNORMAL LOW (ref >=50)
LDL Cholesterol (Calc): 200 mg/dL (calc) — ABNORMAL HIGH
Non-HDL Cholesterol (Calc): 234 mg/dL (calc) — ABNORMAL HIGH (ref <130)
Total CHOL/HDL Ratio: 6.2 (calc) — ABNORMAL HIGH (ref <5.0)
Triglycerides: 172 mg/dL — ABNORMAL HIGH (ref <150)

## 2020-02-06 LAB — CBC WITH DIFFERENTIAL/PLATELET
Absolute Monocytes: 423 cells/uL (ref 200–950)
Basophils Absolute: 71 cells/uL (ref 0–200)
Basophils Relative: 1.4 %
Eosinophils Absolute: 321 cells/uL (ref 15–500)
Eosinophils Relative: 6.3 %
HCT: 45.7 % — ABNORMAL HIGH (ref 35.0–45.0)
Hemoglobin: 15.6 g/dL — ABNORMAL HIGH (ref 11.7–15.5)
Lymphs Abs: 1647 cells/uL (ref 850–3900)
MCH: 32.1 pg (ref 27.0–33.0)
MCHC: 34.1 g/dL (ref 32.0–36.0)
MCV: 94 fL (ref 80.0–100.0)
MPV: 10.2 fL (ref 7.5–12.5)
Monocytes Relative: 8.3 %
Neutro Abs: 2637 cells/uL (ref 1500–7800)
Neutrophils Relative %: 51.7 %
Platelets: 234 10*3/uL (ref 140–400)
RBC: 4.86 10*6/uL (ref 3.80–5.10)
RDW: 13.2 % (ref 11.0–15.0)
Total Lymphocyte: 32.3 %
WBC: 5.1 10*3/uL (ref 3.8–10.8)

## 2020-02-06 LAB — T4, FREE: Free T4: 1 ng/dL (ref 0.8–1.8)

## 2020-02-06 LAB — T3, FREE: T3, Free: 2.9 pg/mL (ref 2.3–4.2)

## 2020-02-06 LAB — TSH: TSH: 0.83 mIU/L (ref 0.40–4.50)

## 2020-02-11 ENCOUNTER — Inpatient Hospital Stay: Admission: RE | Admit: 2020-02-11 | Payer: Medicare HMO | Source: Ambulatory Visit

## 2020-02-18 ENCOUNTER — Other Ambulatory Visit: Payer: Self-pay

## 2020-02-18 ENCOUNTER — Ambulatory Visit (INDEPENDENT_AMBULATORY_CARE_PROVIDER_SITE_OTHER)
Admission: RE | Admit: 2020-02-18 | Discharge: 2020-02-18 | Disposition: A | Discharge status: Home / Self Care | Payer: Medicare HMO | Source: Ambulatory Visit | Attending: Family Medicine | Admitting: Family Medicine

## 2020-02-18 DIAGNOSIS — M810 Age-related osteoporosis without current pathological fracture: Secondary | ICD-10-CM | POA: Diagnosis not present

## 2020-02-26 ENCOUNTER — Telehealth: Payer: Self-pay | Admitting: Family Medicine

## 2020-02-26 NOTE — Telephone Encounter (Signed)
Left message for patient to schedule Annual Wellness Visit.  Please schedule with Nurse Health Advisor Shannon Crews, RN at [facility]  

## 2020-05-10 ENCOUNTER — Telehealth: Payer: Self-pay | Admitting: Family Medicine

## 2020-05-10 NOTE — Telephone Encounter (Signed)
Left message for patient to call back and schedule Medicare Annual Wellness Visit (AWV) either virtually or in office.   Last AWV no information please schedule at anytime with [facility] Nurse Health Advisor 1 or 2   This should be a 45 minute visit. 

## 2020-07-15 DIAGNOSIS — Z01419 Encounter for gynecological examination (general) (routine) without abnormal findings: Secondary | ICD-10-CM | POA: Diagnosis not present

## 2020-07-15 DIAGNOSIS — Z124 Encounter for screening for malignant neoplasm of cervix: Secondary | ICD-10-CM | POA: Diagnosis not present

## 2020-07-15 DIAGNOSIS — Z01411 Encounter for gynecological examination (general) (routine) with abnormal findings: Secondary | ICD-10-CM | POA: Diagnosis not present

## 2020-07-15 DIAGNOSIS — Z6825 Body mass index (BMI) 25.0-25.9, adult: Secondary | ICD-10-CM | POA: Diagnosis not present

## 2020-07-15 DIAGNOSIS — Z1231 Encounter for screening mammogram for malignant neoplasm of breast: Secondary | ICD-10-CM | POA: Diagnosis not present

## 2020-07-15 DIAGNOSIS — R87612 Low grade squamous intraepithelial lesion on cytologic smear of cervix (LGSIL): Secondary | ICD-10-CM | POA: Diagnosis not present

## 2020-07-15 DIAGNOSIS — D259 Leiomyoma of uterus, unspecified: Secondary | ICD-10-CM | POA: Diagnosis not present

## 2020-07-15 DIAGNOSIS — N952 Postmenopausal atrophic vaginitis: Secondary | ICD-10-CM | POA: Diagnosis not present

## 2020-07-15 LAB — HM MAMMOGRAPHY

## 2020-08-08 ENCOUNTER — Telehealth: Payer: Self-pay | Admitting: Family Medicine

## 2020-08-08 NOTE — Telephone Encounter (Signed)
Left message for patient to call back and schedule Medicare Annual Wellness Visit (AWV) either virtually or in office. No detailed message   AWVI  please schedule at anytime with [facility] Nurse Health Advisor 1 or 2   This should be a 45 minute visit. 

## 2020-10-28 ENCOUNTER — Telehealth (INDEPENDENT_AMBULATORY_CARE_PROVIDER_SITE_OTHER): Payer: Medicare HMO | Admitting: Family Medicine

## 2020-10-28 ENCOUNTER — Encounter: Payer: Self-pay | Admitting: Family Medicine

## 2020-10-28 VITALS — Wt 134.0 lb

## 2020-10-28 DIAGNOSIS — U071 COVID-19: Secondary | ICD-10-CM

## 2020-10-28 MED ORDER — MOLNUPIRAVIR EUA 200MG CAPSULE: 4.0000 | ORAL_CAPSULE | Freq: Two times a day (BID) | ORAL | 0 refills | Status: AC

## 2020-10-28 NOTE — Progress Notes (Signed)
Subjective:    Patient ID: Brandy Boyle, female    DOB: [birth year], 70 y.o.   MRN: [id number]  HPI Virtual Visit via Video Note  I connected with the patient on 10/28/20 at  2:00 PM EDT by a video enabled telemedicine application and verified that I am speaking with the correct person using two identifiers.  Location patient: home Location provider:work or home office Persons participating in the virtual visit: patient, provider  I discussed the limitations of evaluation and management by telemedicine and the availability of in person appointments. The patient expressed understanding and agreed to proceed.   HPI: Here for a Covid-19 infection. She attended her daughter's wedding this past weekend, and 2 days later she developed fever, body aches, and a dry cough. No SOB or NVD. She tested positive for the Covid virus 3 days ago. She is drinking fluids and taking Tylenol and Mucinex.    ROS: See pertinent positives and negatives per HPI.  Past Medical History:  Diagnosis Date  . Anxiety   . Arthritis   . Bloating   . Cyclic vomiting syndrome - suspected 10/13/2014  . Depression   . GERD (gastroesophageal reflux disease)   . Helicobacter pylori gastritis 09/09/2014  . History of epigastric pain   . Hyperlipidemia   . Hypothyroidism   . Migraine headache with aura   . Nausea & vomiting    chronic  . Osteopenia    last DEXA 01-28-08  . Vitreous tug syndrome    sees Dr. Sherlynn Stalls     Past Surgical History:  Procedure Laterality Date  . COLONOSCOPY  03-18-14   per Dr. Carlean Purl, diverticulosis only, repeat in 10 years  . ESOPHAGOGASTRODUODENOSCOPY  09-06-14   per Dr. Carlean Purl, had H pylori gastr  . TONSILLECTOMY  1958    Family History  Problem Relation Age of Onset  . Cholecystitis Sister        sister had  cholectectomy  . Colon cancer Paternal Grandmother 40  . Heart disease Father   . Pancreatic cancer Cousin   . Alcohol abuse Other        Family Hx  .  Arthritis Other        Family Hx  . Breast cancer Other        1st degree  relative <50  . Diabetes Other        1st degree relative  . Hypertension Other        Family History   . Stroke Other        Family Hx 1st degree <50  . Heart disease Other        Family Hx  . Esophageal cancer Neg Hx   . Rectal cancer Neg Hx   . Stomach cancer Neg Hx      Current Outpatient Medications:  .  acetaminophen (TYLENOL) 500 MG tablet, Take 1,000 mg by mouth every 6 (six) hours as needed for mild pain, moderate pain or headache., Disp: , Rfl:  .  Calcium Carbonate-Vitamin D (CALCIUM + D PO), Take by mouth daily., Disp: , Rfl:  .  clonazePAM (KLONOPIN) 1 MG tablet, Take 1 tablet (1 mg total) by mouth 3 (three) times daily as needed. for anxiety, Disp: 270 tablet, Rfl: 1 .  DULoxetine (CYMBALTA) 30 MG capsule, Take 1 capsule (30 mg total) by mouth daily., Disp: 90 capsule, Rfl: 3 .  levothyroxine (SYNTHROID) 50 MCG tablet, Take 1 tablet (50 mcg total) by mouth daily., Disp: 90 tablet,  Rfl: 3 .  molnupiravir EUA 200 mg CAPS, Take 4 capsules (800 mg total) by mouth 2 (two) times daily for 5 days., Disp: 40 capsule, Rfl: 0 .  Multiple Vitamins-Minerals (MULTIVITAMIN WITH MINERALS) tablet, Take 1 tablet by mouth daily., Disp: , Rfl:  .  ondansetron (ZOFRAN) 8 MG tablet, Take 1 tablet (8 mg total) by mouth every 8 (eight) hours as needed for nausea or vomiting., Disp: 30 tablet, Rfl: 2 .  phenazopyridine (PYRIDIUM) 200 MG tablet, Take 1 tablet (200 mg total) by mouth 3 (three) times daily as needed for pain., Disp: 30 tablet, Rfl: 1 .  prochlorperazine (COMPAZINE) 25 MG suppository, Place 1 suppository (25 mg total) rectally every 8 (eight) hours as needed for nausea or vomiting., Disp: 30 suppository, Rfl: 2 .  temazepam (RESTORIL) 30 MG capsule, Take 1 capsule (30 mg total) by mouth at bedtime as needed. for sleep, Disp: 90 capsule, Rfl: 1 .  traMADol (ULTRAM) 50 MG tablet, Take 1 tablet (50 mg total) by  mouth every 6 (six) hours as needed for moderate pain., Disp: 360 tablet, Rfl: 0 .  rosuvastatin (CRESTOR) 10 MG tablet, Take 1 tablet (10 mg total) by mouth daily. (Patient not taking: Reported on 10/28/2020), Disp: 90 tablet, Rfl: 3  EXAM:  VITALS per patient if applicable:  GENERAL: alert, oriented, appears well and in no acute distress  HEENT: atraumatic, conjunttiva clear, no obvious abnormalities on inspection of external nose and ears  NECK: normal movements of the head and neck  LUNGS: on inspection no signs of respiratory distress, breathing rate appears normal, no obvious gross SOB, gasping or wheezing  CV: no obvious cyanosis  MS: moves all visible extremities without noticeable abnormality  PSYCH/NEURO: pleasant and cooperative, no obvious depression or anxiety, speech and thought processing grossly intact  ASSESSMENT AND PLAN: Covid-19 infection. Treat with 5 days of Molnupiravir.  Alysia Penna, MD  Discussed the following assessment and plan:  No diagnosis found.     I discussed the assessment and treatment plan with the patient. The patient was provided an opportunity to ask questions and all were answered. The patient agreed with the plan and demonstrated an understanding of the instructions.   The patient was advised to call back or seek an in-person evaluation if the symptoms worsen or if the condition fails to improve as anticipated.     Review of Systems     Objective:   Physical Exam        Assessment & Plan:

## 2020-11-15 ENCOUNTER — Telehealth: Payer: Self-pay | Admitting: Family Medicine

## 2020-11-15 NOTE — Telephone Encounter (Signed)
Left message for patient to call back and schedule Medicare Annual Wellness Visit (AWV) either virtually or in office.   AWV-I PER [place] 09/25/16 please schedule at anytime with [facility] Nurse Health Advisor 1 or 2   This should be a 45 minute visit.

## 2020-11-23 ENCOUNTER — Other Ambulatory Visit: Payer: Self-pay

## 2020-12-22 ENCOUNTER — Telehealth: Payer: Self-pay

## 2020-12-22 NOTE — Telephone Encounter (Signed)
Patient called back regarding AWV that was scheduled for today. She says that she has her phone silenced for numbers that are not saved and missed the call. Informed her that she would have to reschedule.  Patient rescheduled for 08/04 at 2:30pm.

## 2020-12-22 NOTE — Progress Notes (Deleted)
Subjective:   Brandy Boyle is a 70 y.o. female who presents for an Initial Medicare Annual Wellness Visit.  Review of Systems    N/a       Objective:    There were no vitals filed for this visit. There is no height or weight on file to calculate BMI.  Advanced Directives 08/16/2014 03/03/2014  Does Patient Have a Medical Advance Directive? No No  Would patient like information on creating a medical advance directive? No - patient declined information -    Current Medications (verified) Outpatient Encounter Medications as of 12/22/2020  Medication Sig   acetaminophen (TYLENOL) 500 MG tablet Take 1,000 mg by mouth every 6 (six) hours as needed for mild pain, moderate pain or headache.   Calcium Carbonate-Vitamin D (CALCIUM + D PO) Take by mouth daily.   clonazePAM (KLONOPIN) 1 MG tablet Take 1 tablet (1 mg total) by mouth 3 (three) times daily as needed. for anxiety   DULoxetine (CYMBALTA) 30 MG capsule Take 1 capsule (30 mg total) by mouth daily.   levothyroxine (SYNTHROID) 50 MCG tablet Take 1 tablet (50 mcg total) by mouth daily.   Multiple Vitamins-Minerals (MULTIVITAMIN WITH MINERALS) tablet Take 1 tablet by mouth daily.   ondansetron (ZOFRAN) 8 MG tablet Take 1 tablet (8 mg total) by mouth every 8 (eight) hours as needed for nausea or vomiting.   phenazopyridine (PYRIDIUM) 200 MG tablet Take 1 tablet (200 mg total) by mouth 3 (three) times daily as needed for pain.   prochlorperazine (COMPAZINE) 25 MG suppository Place 1 suppository (25 mg total) rectally every 8 (eight) hours as needed for nausea or vomiting.   rosuvastatin (CRESTOR) 10 MG tablet Take 1 tablet (10 mg total) by mouth daily. (Patient not taking: Reported on 10/28/2020)   temazepam (RESTORIL) 30 MG capsule Take 1 capsule (30 mg total) by mouth at bedtime as needed. for sleep   traMADol (ULTRAM) 50 MG tablet Take 1 tablet (50 mg total) by mouth every 6 (six) hours as needed for moderate pain.   [DISCONTINUED]  omeprazole (PRILOSEC) 40 MG capsule Take 1 capsule (40 mg total) by mouth daily. (Patient not taking: Reported on 08/26/2014)   No facility-administered encounter medications on file as of 12/22/2020.    Allergies (verified) Patient has no known allergies.   History: Past Medical History:  Diagnosis Date   Anxiety    Arthritis    Bloating    Cyclic vomiting syndrome - suspected 10/13/2014   Depression    GERD (gastroesophageal reflux disease)    Helicobacter pylori gastritis 09/09/2014   History of epigastric pain    Hyperlipidemia    Hypothyroidism    Migraine headache with aura    Nausea & vomiting    chronic   Osteopenia    last DEXA 01-28-08   Vitreous tug syndrome    sees Dr. Sherlynn Stalls    Past Surgical History:  Procedure Laterality Date   COLONOSCOPY  03-18-14   per Dr. Carlean Purl, diverticulosis only, repeat in 10 years   ESOPHAGOGASTRODUODENOSCOPY  09-06-14   per Dr. Carlean Purl, had H pylori gastr   TONSILLECTOMY  1958   Family History  Problem Relation Age of Onset   Cholecystitis Sister        sister had  cholectectomy   Colon cancer Paternal Grandmother 33   Heart disease Father    Pancreatic cancer Cousin    Alcohol abuse Other        Family Hx   Arthritis  Other        Family Hx   Breast cancer Other        1st degree  relative <50   Diabetes Other        1st degree relative   Hypertension Other        Family History    Stroke Other        Family Hx 1st degree <50   Heart disease Other        Family Hx   Esophageal cancer Neg Hx    Rectal cancer Neg Hx    Stomach cancer Neg Hx    Social History   Socioeconomic History   Marital status: Single    Spouse name: Not on file   Number of children: Not on file   Years of education: Not on file   Highest education level: Not on file  Occupational History   Not on file  Tobacco Use   Smoking status: Never   Smokeless tobacco: Never  Substance and Sexual Activity   Alcohol use: No    Alcohol/week:  0.0 standard drinks   Drug use: No   Sexual activity: Not on file  Other Topics Concern   Not on file  Social History Narrative   Not on file   Social Determinants of Health   Financial Resource Strain: Not on file  Food Insecurity: Not on file  Transportation Needs: Not on file  Physical Activity: Not on file  Stress: Not on file  Social Connections: Not on file    Tobacco Counseling Counseling given: Not Answered   Clinical Intake:                 Diabetic?no         Activities of Daily Living No flowsheet data found.  Patient Care Team: Laurey Morale, MD as PCP - General  Indicate any recent Medical Services you may have received from other than [facility] providers in the past year (date may be approximate).     Assessment:   This is a routine wellness examination for Brandy Boyle.  Hearing/Vision screen No results found.  Dietary issues and exercise activities discussed:     Goals Addressed   None    Depression Screen No flowsheet data found.  Fall Risk No flowsheet data found.  FALL RISK PREVENTION PERTAINING TO THE HOME:  Any stairs in or around the home? {YES/NO:21197} If so, are there any without handrails? {YES/NO:21197} Home free of loose throw rugs in walkways, pet beds, electrical cords, etc? {YES/NO:21197} Adequate lighting in your home to reduce risk of falls? {YES/NO:21197}  ASSISTIVE DEVICES UTILIZED TO PREVENT FALLS:  Life alert? {YES/NO:21197} Use of a cane, walker or w/c? {YES/NO:21197} Grab bars in the bathroom? {YES/NO:21197} Shower chair or bench in shower? {YES/NO:21197} Elevated toilet seat or a handicapped toilet? {YES/NO:21197}  TIMED UP AND GO:  Was the test performed? {YES/NO:21197}.  Length of time to ambulate 10 feet: *** sec.   {Appearance of [id number]  Cognitive Function:        Immunizations Immunization History  Administered Date(s) Administered   Influenza-Unspecified 04/03/2015, 02/25/2018    PFIZER(Purple Top)SARS-COV-2 Vaccination 06/18/2019, 07/09/2019   PPD Test 07/02/2017   Pneumococcal Conjugate-13 01/27/2019   Tdap 01/27/2019   Zoster, Live 04/03/2015    {TDAP status:2101805}  {Flu Vaccine status:2101806}  {Pneumococcal vaccine status:2101807}  {Covid-19 vaccine status:2101808}  Qualifies for Shingles Vaccine? {YES/NO:21197}  Zostavax completed {YES/NO:21197}  {Shingrix Completed?:2101804}  Screening Tests Health Maintenance  Topic Date  Due   Hepatitis C Screening  Never done   Zoster Vaccines- Shingrix (1 of 2) Never done   COVID-19 Vaccine (3 - Booster for Pfizer series) 12/06/2019   PNA vac Low Risk Adult (2 of 2 - PPSV23) 01/27/2020   INFLUENZA VACCINE  12/26/2020   MAMMOGRAM  07/15/2022   COLONOSCOPY (Pts 45-47yr Insurance coverage will need to be confirmed)  03/18/2024   TETANUS/TDAP  01/26/2029   DEXA SCAN  Completed   HPV VACCINES  Aged Out    Health Maintenance  Health Maintenance Due  Topic Date Due   Hepatitis C Screening  Never done   Zoster Vaccines- Shingrix (1 of 2) Never done   COVID-19 Vaccine (3 - Booster for Pfizer series) 12/06/2019   PNA vac Low Risk Adult (2 of 2 - PPSV23) 01/27/2020    {Colorectal cancer screening:2101809}  {Mammogram status:21018020}  {Bone Density status:21018021}  Lung Cancer Screening: (Low Dose CT Chest recommended if Age 25-80 years, 30 pack-year currently smoking OR have quit w/in 15years.) {DOES NOT does:27190::"does not"} qualify.   Lung Cancer Screening Referral: ***  Additional Screening:  Hepatitis C Screening: {DOES NOT does:27190::"does not"} qualify; Completed ***  Vision Screening: Recommended annual ophthalmology exams for early detection of glaucoma and other disorders of the eye. Is the patient up to date with their annual eye exam?  {YES/NO:21197} Who is the provider or what is the name of the office in which the patient attends annual eye exams? *** If pt is not established  with a provider, would they like to be referred to a provider to establish care? {YES/NO:21197}.   Dental Screening: Recommended annual dental exams for proper oral hygiene  Community Resource Referral / Chronic Care Management: CRR required this visit?  {YES/NO:21197}  CCM required this visit?  {YES/NO:21197}     Plan:     I have personally reviewed and noted the following in the patient's chart:   Medical and social history Use of alcohol, tobacco or illicit drugs  Current medications and supplements including opioid prescriptions. {Opioid [phone]} Functional ability and status Nutritional status Physical activity Advanced directives List of other physicians Hospitalizations, surgeries, and ER visits in previous 12 months Vitals Screenings to include cognitive, depression, and falls Referrals and appointments  In addition, I have reviewed and discussed with patient certain preventive protocols, quality metrics, and best practice recommendations. A written personalized care plan for preventive services as well as general preventive health recommendations were provided to patient.     LRandel Pigg LPN   [id number]  Nurse Notes: ***

## 2020-12-22 NOTE — Telephone Encounter (Signed)
Patient did not connect for 230 pm Virtual visit called patient left voice mail to return call.  Patient may reschedule next available appointment.  L.Lauran Romanski,LPN

## 2020-12-29 ENCOUNTER — Ambulatory Visit (INDEPENDENT_AMBULATORY_CARE_PROVIDER_SITE_OTHER): Payer: Medicare HMO

## 2020-12-29 DIAGNOSIS — Z0000 Encounter for general adult medical examination without abnormal findings: Secondary | ICD-10-CM | POA: Diagnosis not present

## 2020-12-29 NOTE — Patient Instructions (Signed)
Brandy Boyle , Thank you for taking time to come for your Medicare Wellness Visit. I appreciate your ongoing commitment to your health goals. Please review the following plan we discussed and let me know if I can assist you in the future.   Screening recommendations/referrals: Colonoscopy: 03/18/2014  due 2025 Mammogram: 07/15/2020 Bone Density: 02/18/2020 Recommended yearly ophthalmology/optometry visit for glaucoma screening and checkup Recommended yearly dental visit for hygiene and checkup  Vaccinations: Influenza vaccine: due in fall 2022  Pneumococcal vaccine: due 2nd dose  Tdap vaccine: 01/27/2019 Shingles vaccine: will obtain local pharmacy     Advanced directives: none   Conditions/risks identified: none   Next appointment: none    Preventive Care 27 Years and Older, Female Preventive care refers to lifestyle choices and visits with your health care provider that can promote health and wellness. What does preventive care include? A yearly physical exam. This is also called an annual well check. Dental exams once or twice a year. Routine eye exams. Ask your health care provider how often you should have your eyes checked. Personal lifestyle choices, including: Daily care of your teeth and gums. Regular physical activity. Eating a healthy diet. Avoiding tobacco and drug use. Limiting alcohol use. Practicing safe sex. Taking low-dose aspirin every day. Taking vitamin and mineral supplements as recommended by your health care provider. What happens during an annual well check? The services and screenings done by your health care provider during your annual well check will depend on your age, overall health, lifestyle risk factors, and family history of disease. Counseling  Your health care provider may ask you questions about your: Alcohol use. Tobacco use. Drug use. Emotional well-being. Home and relationship well-being. Sexual activity. Eating habits. History of  falls. Memory and ability to understand (cognition). Work and work Statistician. Reproductive health. Screening  You may have the following tests or measurements: Height, weight, and BMI. Blood pressure. Lipid and cholesterol levels. These may be checked every 5 years, or more frequently if you are over 46 years old. Skin check. Lung cancer screening. You may have this screening every year starting at age 22 if you have a 30-pack-year history of smoking and currently smoke or have quit within the past 15 years. Fecal occult blood test (FOBT) of the stool. You may have this test every year starting at age 53. Flexible sigmoidoscopy or colonoscopy. You may have a sigmoidoscopy every 5 years or a colonoscopy every 10 years starting at age 76. Hepatitis C blood test. Hepatitis B blood test. Sexually transmitted disease (STD) testing. Diabetes screening. This is done by checking your blood sugar (glucose) after you have not eaten for a while (fasting). You may have this done every 1-3 years. Bone density scan. This is done to screen for osteoporosis. You may have this done starting at age 29. Mammogram. This may be done every 1-2 years. Talk to your health care provider about how often you should have regular mammograms. Talk with your health care provider about your test results, treatment options, and if necessary, the need for more tests. Vaccines  Your health care provider may recommend certain vaccines, such as: Influenza vaccine. This is recommended every year. Tetanus, diphtheria, and acellular pertussis (Tdap, Td) vaccine. You may need a Td booster every 10 years. Zoster vaccine. You may need this after age 65. Pneumococcal 13-valent conjugate (PCV13) vaccine. One dose is recommended after age 68. Pneumococcal polysaccharide (PPSV23) vaccine. One dose is recommended after age 75. Talk to your health care provider  about which screenings and vaccines you need and how often you need  them. This information is not intended to replace advice given to you by your health care provider. Make sure you discuss any questions you have with your health care provider. Document Released: 06/10/2015 Document Revised: 02/01/2016 Document Reviewed: 03/15/2015 Elsevier Interactive Patient Education  2017 [place] Prevention in the Home Falls can cause injuries. They can happen to people of all ages. There are many things you can do to make your home safe and to help prevent falls. What can I do on the outside of my home? Regularly fix the edges of walkways and driveways and fix any cracks. Remove anything that might make you trip as you walk through a door, such as a raised step or threshold. Trim any bushes or trees on the path to your home. Use bright outdoor lighting. Clear any walking paths of anything that might make someone trip, such as rocks or tools. Regularly check to see if handrails are loose or broken. Make sure that both sides of any steps have handrails. Any raised decks and porches should have guardrails on the edges. Have any leaves, snow, or ice cleared regularly. Use sand or salt on walking paths during winter. Clean up any spills in your garage right away. This includes oil or grease spills. What can I do in the bathroom? Use night lights. Install grab bars by the toilet and in the tub and shower. Do not use towel bars as grab bars. Use non-skid mats or decals in the tub or shower. If you need to sit down in the shower, use a plastic, non-slip stool. Keep the floor dry. Clean up any water that spills on the floor as soon as it happens. Remove soap buildup in the tub or shower regularly. Attach bath mats securely with double-sided non-slip rug tape. Do not have throw rugs and other things on the floor that can make you trip. What can I do in the bedroom? Use night lights. Make sure that you have a light by your bed that is easy to reach. Do not use  any sheets or blankets that are too big for your bed. They should not hang down onto the floor. Have a firm chair that has side arms. You can use this for support while you get dressed. Do not have throw rugs and other things on the floor that can make you trip. What can I do in the kitchen? Clean up any spills right away. Avoid walking on wet floors. Keep items that you use a lot in easy-to-reach places. If you need to reach something above you, use a strong step stool that has a grab bar. Keep electrical cords out of the way. Do not use floor polish or wax that makes floors slippery. If you must use wax, use non-skid floor wax. Do not have throw rugs and other things on the floor that can make you trip. What can I do with my stairs? Do not leave any items on the stairs. Make sure that there are handrails on both sides of the stairs and use them. Fix handrails that are broken or loose. Make sure that handrails are as long as the stairways. Check any carpeting to make sure that it is firmly attached to the stairs. Fix any carpet that is loose or worn. Avoid having throw rugs at the top or bottom of the stairs. If you do have throw rugs, attach them to the floor  with carpet tape. Make sure that you have a light switch at the top of the stairs and the bottom of the stairs. If you do not have them, ask someone to add them for you. What else can I do to help prevent falls? Wear shoes that: Do not have high heels. Have rubber bottoms. Are comfortable and fit you well. Are closed at the toe. Do not wear sandals. If you use a stepladder: Make sure that it is fully opened. Do not climb a closed stepladder. Make sure that both sides of the stepladder are locked into place. Ask someone to hold it for you, if possible. Clearly mark and make sure that you can see: Any grab bars or handrails. First and last steps. Where the edge of each step is. Use tools that help you move around (mobility aids)  if they are needed. These include: Canes. Walkers. Scooters. Crutches. Turn on the lights when you go into a dark area. Replace any light bulbs as soon as they burn out. Set up your furniture so you have a clear path. Avoid moving your furniture around. If any of your floors are uneven, fix them. If there are any pets around you, be aware of where they are. Review your medicines with your doctor. Some medicines can make you feel dizzy. This can increase your chance of falling. Ask your doctor what other things that you can do to help prevent falls. This information is not intended to replace advice given to you by your health care provider. Make sure you discuss any questions you have with your health care provider. Document Released: 03/10/2009 Document Revised: 10/20/2015 Document Reviewed: 06/18/2014 Elsevier Interactive Patient Education  2017 [employer].

## 2020-12-29 NOTE — Progress Notes (Signed)
Subjective:   Brandy Boyle is a 70 y.o. female who presents for an Initial Medicare Annual Wellness Visit.   Virtual Visit via Video Note  I connected with Brandy Boyle by a video enabled telemedicine application and verified that I am speaking with the correct person using two identifiers.  Location: Patient: Home Provider: Office Persons participating in the virtual visit: patient, provider   I discussed the limitations of evaluation and management by telemedicine and the availability of in person appointments. The patient expressed understanding and agreed to proceed.     Mickel Baas Mataio Mele,LPN  Review of Systems    N/a       Objective:    There were no vitals filed for this visit. There is no height or weight on file to calculate BMI.  Advanced Directives 08/16/2014 03/03/2014  Does Patient Have a Medical Advance Directive? No No  Would patient like information on creating a medical advance directive? No - patient declined information -    Current Medications (verified) Outpatient Encounter Medications as of 12/29/2020  Medication Sig   acetaminophen (TYLENOL) 500 MG tablet Take 1,000 mg by mouth every 6 (six) hours as needed for mild pain, moderate pain or headache.   Calcium Carbonate-Vitamin D (CALCIUM + D PO) Take by mouth daily.   clonazePAM (KLONOPIN) 1 MG tablet Take 1 tablet (1 mg total) by mouth 3 (three) times daily as needed. for anxiety   DULoxetine (CYMBALTA) 30 MG capsule Take 1 capsule (30 mg total) by mouth daily.   levothyroxine (SYNTHROID) 50 MCG tablet Take 1 tablet (50 mcg total) by mouth daily.   Multiple Vitamins-Minerals (MULTIVITAMIN WITH MINERALS) tablet Take 1 tablet by mouth daily.   ondansetron (ZOFRAN) 8 MG tablet Take 1 tablet (8 mg total) by mouth every 8 (eight) hours as needed for nausea or vomiting.   phenazopyridine (PYRIDIUM) 200 MG tablet Take 1 tablet (200 mg total) by mouth 3 (three) times daily as needed for pain.    prochlorperazine (COMPAZINE) 25 MG suppository Place 1 suppository (25 mg total) rectally every 8 (eight) hours as needed for nausea or vomiting.   rosuvastatin (CRESTOR) 10 MG tablet Take 1 tablet (10 mg total) by mouth daily. (Patient not taking: Reported on 10/28/2020)   temazepam (RESTORIL) 30 MG capsule Take 1 capsule (30 mg total) by mouth at bedtime as needed. for sleep   traMADol (ULTRAM) 50 MG tablet Take 1 tablet (50 mg total) by mouth every 6 (six) hours as needed for moderate pain.   [DISCONTINUED] omeprazole (PRILOSEC) 40 MG capsule Take 1 capsule (40 mg total) by mouth daily. (Patient not taking: Reported on 08/26/2014)   No facility-administered encounter medications on file as of 12/29/2020.    Allergies (verified) Patient has no known allergies.   History: Past Medical History:  Diagnosis Date   Anxiety    Arthritis    Bloating    Cyclic vomiting syndrome - suspected 10/13/2014   Depression    GERD (gastroesophageal reflux disease)    Helicobacter pylori gastritis 09/09/2014   History of epigastric pain    Hyperlipidemia    Hypothyroidism    Migraine headache with aura    Nausea & vomiting    chronic   Osteopenia    last DEXA 01-28-08   Vitreous tug syndrome    sees Dr. Sherlynn Stalls    Past Surgical History:  Procedure Laterality Date   COLONOSCOPY  03-18-14   per Dr. Carlean Purl, diverticulosis only, repeat in 10 years  ESOPHAGOGASTRODUODENOSCOPY  09-06-14   per Dr. Carlean Purl, had H pylori gastr   TONSILLECTOMY  1958   Family History  Problem Relation Age of Onset   Cholecystitis Sister        sister had  cholectectomy   Colon cancer Paternal Grandmother 12   Heart disease Father    Pancreatic cancer Cousin    Alcohol abuse Other        Family Hx   Arthritis Other        Family Hx   Breast cancer Other        1st degree  relative <50   Diabetes Other        1st degree relative   Hypertension Other        Family History    Stroke Other        Family Hx  1st degree <50   Heart disease Other        Family Hx   Esophageal cancer Neg Hx    Rectal cancer Neg Hx    Stomach cancer Neg Hx    Social History   Socioeconomic History   Marital status: Single    Spouse name: Not on file   Number of children: Not on file   Years of education: Not on file   Highest education level: Not on file  Occupational History   Not on file  Tobacco Use   Smoking status: Never   Smokeless tobacco: Never  Substance and Sexual Activity   Alcohol use: No    Alcohol/week: 0.0 standard drinks   Drug use: No   Sexual activity: Not on file  Other Topics Concern   Not on file  Social History Narrative   Not on file   Social Determinants of Health   Financial Resource Strain: Not on file  Food Insecurity: Not on file  Transportation Needs: Not on file  Physical Activity: Not on file  Stress: Not on file  Social Connections: Not on file    Tobacco Counseling Counseling given: Not Answered   Clinical Intake:                 Diabetic?no         Activities of Daily Living No flowsheet data found.  Patient Care Team: Laurey Morale, MD as PCP - General  Indicate any recent Medical Services you may have received from other than [facility] providers in the past year (date may be approximate).     Assessment:   This is a routine wellness examination for Brandy Boyle.  Hearing/Vision screen No results found.  Dietary issues and exercise activities discussed:     Goals Addressed   None    Depression Screen No flowsheet data found.  Fall Risk No flowsheet data found.  FALL RISK PREVENTION PERTAINING TO THE HOME:  Any stairs in or around the home? Yes  If so, are there any without handrails? No  Home free of loose throw rugs in walkways, pet beds, electrical cords, etc? Yes  Adequate lighting in your home to reduce risk of falls? Yes   ASSISTIVE DEVICES UTILIZED TO PREVENT FALLS:  Life alert? No  Use of a cane, walker or  w/c? No  Grab bars in the bathroom? No  Shower chair or bench in shower? No  Elevated toilet seat or a handicapped toilet? No    Cognitive Function:  Normal cognitive status assessed by direct observation by this Nurse Health Advisor. No abnormalities found.  Immunizations Immunization History  Administered Date(s) Administered   Influenza-Unspecified 04/03/2015, 02/25/2018   PFIZER(Purple Top)SARS-COV-2 Vaccination 06/18/2019, 07/09/2019   PPD Test 07/02/2017   Pneumococcal Conjugate-13 01/27/2019   Tdap 01/27/2019   Zoster, Live 04/03/2015    TDAP status: Up to date  Flu Vaccine status: Up to date  Pneumococcal vaccine status: Up to date  Covid-19 vaccine status: Completed vaccines  Qualifies for Shingles Vaccine? Yes   Zostavax completed No   Shingrix Completed?: No.    Education has been provided regarding the importance of this vaccine. Patient has been advised to call insurance company to determine out of pocket expense if they have not yet received this vaccine. Advised may also receive vaccine at local pharmacy or Health Dept. Verbalized acceptance and understanding.  Screening Tests Health Maintenance  Topic Date Due   Hepatitis C Screening  Never done   Zoster Vaccines- Shingrix (1 of 2) Never done   COVID-19 Vaccine (3 - Booster for Pfizer series) 12/06/2019   PNA vac Low Risk Adult (2 of 2 - PPSV23) 01/27/2020   INFLUENZA VACCINE  12/26/2020   MAMMOGRAM  07/15/2021   COLONOSCOPY (Pts 45-55yr Insurance coverage will need to be confirmed)  03/18/2024   TETANUS/TDAP  01/26/2029   DEXA SCAN  Completed   HPV VACCINES  Aged Out    Health Maintenance  Health Maintenance Due  Topic Date Due   Hepatitis C Screening  Never done   Zoster Vaccines- Shingrix (1 of 2) Never done   COVID-19 Vaccine (3 - Booster for Pfizer series) 12/06/2019   PNA vac Low Risk Adult (2 of 2 - PPSV23) 01/27/2020   INFLUENZA VACCINE  12/26/2020    Colorectal cancer  screening: Type of screening: Colonoscopy. Completed 03/18/2014. Repeat every 10 years  Mammogram status: Completed 07/15/2020. Repeat every year  Bone Density status: Completed 02/18/2020. Results reflect: Bone density results: OSTEOPENIA. Repeat every 5 years.  Lung Cancer Screening: (Low Dose CT Chest recommended if Age 471-80years, 30 pack-year currently smoking OR have quit w/in 15years.) does not qualify.   Lung Cancer Screening Referral: n/a  Additional Screening:  Hepatitis C Screening: does qualify  Vision Screening: Recommended annual ophthalmology exams for early detection of glaucoma and other disorders of the eye. Is the patient up to date with their annual eye exam?  Yes  Who is the provider or what is the name of the office in which the patient attends annual eye exams? Dr.Shipiro If pt is not established with a provider, would they like to be referred to a provider to establish care? No .   Dental Screening: Recommended annual dental exams for proper oral hygiene  Community Resource Referral / Chronic Care Management: CRR required this visit?  No   CCM required this visit?  No      Plan:     I have personally reviewed and noted the following in the patient's chart:   Medical and social history Use of alcohol, tobacco or illicit drugs  Current medications and supplements including opioid prescriptions. Patient is currently taking opioid prescriptions. Information provided to patient regarding non-opioid alternatives. Patient advised to discuss non-opioid treatment plan with their provider. Functional ability and status Nutritional status Physical activity Advanced directives List of other physicians Hospitalizations, surgeries, and ER visits in previous 12 months Vitals Screenings to include cognitive, depression, and falls Referrals and appointments  In addition, I have reviewed and discussed with patient certain preventive protocols, quality metrics, and  best practice recommendations. A written  personalized care plan for preventive services as well as general preventive health recommendations were provided to patient.     Randel Pigg, LPN   [id number]   Nurse Notes: none

## 2021-01-20 ENCOUNTER — Ambulatory Visit (INDEPENDENT_AMBULATORY_CARE_PROVIDER_SITE_OTHER): Payer: Medicare HMO | Admitting: Family Medicine

## 2021-01-20 ENCOUNTER — Other Ambulatory Visit: Payer: Self-pay

## 2021-01-20 ENCOUNTER — Encounter: Payer: Self-pay | Admitting: Family Medicine

## 2021-01-20 VITALS — BP 130/86 | HR 86 | Temp 98.6°F | Resp 16 | Ht 60.0 in | Wt 133.0 lb

## 2021-01-20 DIAGNOSIS — H6123 Impacted cerumen, bilateral: Secondary | ICD-10-CM

## 2021-01-20 DIAGNOSIS — M545 Low back pain, unspecified: Secondary | ICD-10-CM

## 2021-01-20 DIAGNOSIS — R300 Dysuria: Secondary | ICD-10-CM | POA: Diagnosis not present

## 2021-01-20 MED ORDER — TIZANIDINE HCL 4 MG PO TABS: 4.0000 mg | ORAL_TABLET | Freq: Every day | ORAL | 0 refills | Status: AC

## 2021-01-20 NOTE — Progress Notes (Signed)
ACUTE VISIT Chief Complaint  Patient presents with   Flank Pain    Patient complains of kidney pain, x2 months, Patient reports dull pain on left side, Patient states she experiences pain when lying down, Tried ice therapy, Flector patches, and Tylenol with little relief   HPI: [name] is a 70 y.o. female with hx of depression,anxiety,hypothyroidism,and GERD here today complaining of 6 months of intermittent left-sided back pain as described above.Worse for the past couple days, it was 7/10. Dull/achy like pain. Exacerbated by certain movements. It does not interfere with ADL's  Back Pain This is a recurrent problem. The pain is present in the lumbar spine. The quality of the pain is described as aching. The pain does not radiate. The pain is at a severity of 5/10. The pain is moderate. The pain is Worse during the night. The symptoms are aggravated by twisting. Pertinent negatives include no abdominal pain, bladder incontinence, bowel incontinence, chest pain, dysuria, fever, headaches, leg pain, numbness, paresis, paresthesias, pelvic pain, perianal numbness, weakness or weight loss. Risk factors include menopause. She has tried ice and analgesics for the symptoms. The treatment provided mild relief.  Diclofenac patch has helped.  No hx of recent trauma but she had a fall in 03/2020, no serious injury,she did not seek medical attention. She also moved to a new place and still moving and lifting boxes. No urinary symptoms at this time but a few weeks ago she had mild dysuria that resolved after taking Azo. Negative for increased urinary frequency, gross hematuria,or decreased urine output.  No hx of nephrolithiasis. No prior hx of back pain.  She also has questions about medication for memory she started taking a couple weeks ago, [employer].She has not noted cognitive changes but would like to prevent dementia.  Left ear fullness sensation. She is applying drops,  improved and worse again. Negative for earache,hearing loss,recent URI,or travel.  Review of Systems  Constitutional:  Negative for fever and weight loss.  HENT:  Negative for congestion, mouth sores and sore throat.   Respiratory:  Negative for cough, shortness of breath and wheezing.   Cardiovascular:  Negative for chest pain.  Gastrointestinal:  Negative for abdominal pain and bowel incontinence.  Genitourinary:  Negative for bladder incontinence, dysuria, pelvic pain, vaginal bleeding and vaginal discharge.  Musculoskeletal:  Positive for back pain. Negative for gait problem.  Skin:  Negative for pallor and rash.  Neurological:  Negative for weakness, numbness, headaches and paresthesias.  Psychiatric/Behavioral:  Negative for confusion.   Rest see pertinent positives and negatives per HPI.  Current Outpatient Medications on File Prior to Visit  Medication Sig Dispense Refill   acetaminophen (TYLENOL) 500 MG tablet Take 1,000 mg by mouth every 6 (six) hours as needed for mild pain, moderate pain or headache.     Calcium Carbonate-Vitamin D (CALCIUM + D PO) Take by mouth daily.     clonazePAM (KLONOPIN) 1 MG tablet Take 1 tablet (1 mg total) by mouth 3 (three) times daily as needed. for anxiety 270 tablet 1   DULoxetine (CYMBALTA) 30 MG capsule Take 1 capsule (30 mg total) by mouth daily. 90 capsule 3   levothyroxine (SYNTHROID) 50 MCG tablet Take 1 tablet (50 mcg total) by mouth daily. 90 tablet 3   Multiple Vitamins-Minerals (MULTIVITAMIN WITH MINERALS) tablet Take 1 tablet by mouth daily.     ondansetron (ZOFRAN) 8 MG tablet Take 1 tablet (8 mg total) by mouth every 8 (eight) hours as  needed for nausea or vomiting. 30 tablet 2   phenazopyridine (PYRIDIUM) 200 MG tablet Take 1 tablet (200 mg total) by mouth 3 (three) times daily as needed for pain. 30 tablet 1   prochlorperazine (COMPAZINE) 25 MG suppository Place 1 suppository (25 mg total) rectally every 8 (eight) hours as needed for  nausea or vomiting. 30 suppository 2   temazepam (RESTORIL) 30 MG capsule Take 1 capsule (30 mg total) by mouth at bedtime as needed. for sleep 90 capsule 1   traMADol (ULTRAM) 50 MG tablet Take 1 tablet (50 mg total) by mouth every 6 (six) hours as needed for moderate pain. 360 tablet 0   [DISCONTINUED] omeprazole (PRILOSEC) 40 MG capsule Take 1 capsule (40 mg total) by mouth daily. (Patient not taking: Reported on 08/26/2014) 30 capsule 11   No current facility-administered medications on file prior to visit.   Past Medical History:  Diagnosis Date   Anxiety    Arthritis    Bloating    Cyclic vomiting syndrome - suspected 10/13/2014   Depression    GERD (gastroesophageal reflux disease)    Helicobacter pylori gastritis 09/09/2014   History of epigastric pain    Hyperlipidemia    Hypothyroidism    Migraine headache with aura    Nausea & vomiting    chronic   Osteopenia    last DEXA 01-28-08   Vitreous tug syndrome    sees Dr. Sherlynn Stalls    No Known Allergies  Social History   Socioeconomic History   Marital status: Single    Spouse name: Not on file   Number of children: Not on file   Years of education: Not on file   Highest education level: Not on file  Occupational History   Not on file  Tobacco Use   Smoking status: Never   Smokeless tobacco: Never  Substance and Sexual Activity   Alcohol use: No    Alcohol/week: 0.0 standard drinks   Drug use: No   Sexual activity: Not on file  Other Topics Concern   Not on file  Social History Narrative   Not on file   Social Determinants of Health   Financial Resource Strain: Low Risk    Difficulty of Paying Living Expenses: Not hard at all  Food Insecurity: No Food Insecurity   Worried About Charity fundraiser in the Last Year: Never true   [place] in the Last Year: Never true  Transportation Needs: No Transportation Needs   Lack of Transportation (Medical): No   Lack of Transportation (Non-Medical): No   Physical Activity: Sufficiently Active   Days of Exercise per Week: 5 days   Minutes of Exercise per Session: 60 min  Stress: No Stress Concern Present   Feeling of Stress : Not at all  Social Connections: Moderately Isolated   Frequency of Communication with Friends and Family: Three times a week   Frequency of Social Gatherings with Friends and Family: Three times a week   Attends Religious Services: Never   Active Member of Clubs or Organizations: Yes   Attends Archivist Meetings: More than 4 times per year   Marital Status: Divorced    Vitals:   01/20/21 1413  BP: 130/86  Pulse: 86  Resp: 16  Temp: 98.6 F (37 C)  SpO2: 96%   Body mass index is 25.97 kg/m.  Physical Exam Vitals and nursing note reviewed.  Constitutional:      General: She is not in  acute distress.    Appearance: She is well-developed. She is not ill-appearing.  HENT:     Head: Atraumatic.     Right Ear: External ear normal.     Left Ear: External ear normal.     Ears:     Comments: Cerumen excess bilateral.  Hearing grossly intact, bilateral. Eyes:     Conjunctiva/sclera: Conjunctivae normal.     Pupils: Pupils are equal, round, and reactive to light.  Pulmonary:     Effort: Pulmonary effort is normal. No respiratory distress.     Breath sounds: Normal breath sounds.  Abdominal:     Palpations: Abdomen is soft. There is no hepatomegaly or mass.     Tenderness: There is no abdominal tenderness.  Musculoskeletal:     Lumbar back: Tenderness present. No spasms or bony tenderness. Negative right straight leg raise test and negative left straight leg raise test.       Back:     Comments: No significant deformity appreciated. Mild tenderness upon palpation of paraspinal muscles, left [id number] Pain is not elicited with movement on exam table during examination. No local edema or erythema appreciated, no suspicious lesions.  Lymphadenopathy:     Cervical: No cervical adenopathy.   Skin:    General: Skin is warm.     Findings: No erythema or rash.  Neurological:     Mental Status: She is alert and oriented to person, place, and time.     Gait: Gait normal.  Psychiatric:     Comments: Well groomed, good eye contact.   ASSESSMENT AND PLAN:  Ms.Rhylan was seen today for flank pain.  Diagnoses and all orders for this visit: Orders Placed This Encounter  Procedures   Urine Culture   DG Lumbar Spine Complete   Urinalysis, Routine w reflex microscopic    Acute left-sided low back pain without sciatica Hx and examination suggest musculoskeletal process. Local ice and Diclofenac patch daily prn. Zanaflex 4 mg daily at bedtime prn for up to 21 days, side effects discussed.  Monitor for new symptoms. Lumbar X ray order placed.  -     tiZANidine (ZANAFLEX) 4 MG tablet; Take 1 tablet (4 mg total) by mouth at bedtime for 21 days.  Excessive cerumen in both ear canals Not impacted. We discussed treatment options, including ear lavage (pros and cons). She rpefers to hold on ear lavage for now. Continue OTC Debrox and avoid Q tip use. F/U as needed.  Dysuria Resolved. Hx does not suggest UTI. She would like UA done today. Continue adequate hydration.  We discussed current evidence in regard to dementia prevention. Healthy diet, regular physical activity,antioxidants,and cognitive challenging exercises may help.  Return if symptoms worsen or fail to improve.   Toni Demo G. [place], MD  [facility]. [place] office.

## 2021-01-20 NOTE — Patient Instructions (Addendum)
A few things to remember from today's visit:  Acute left-sided low back pain without sciatica - Plan: tiZANidine (ZANAFLEX) 4 MG tablet, Urinalysis, Routine w reflex microscopic, DG Lumbar Spine Complete  Excessive cerumen in both ear canals  Zanaflex at bed time for a few days. Local massage and ice. Diclofenac patches.  Do not use My Chart to request refills or for acute issues that need immediate attention.   Please be sure medication list is accurate. If a new problem present, please set up appointment sooner than planned today.

## 2021-01-21 ENCOUNTER — Encounter: Payer: Self-pay | Admitting: Family Medicine

## 2021-01-21 LAB — URINALYSIS, ROUTINE W REFLEX MICROSCOPIC
Bilirubin Urine: NEGATIVE
Glucose, UA: NEGATIVE
Hgb urine dipstick: NEGATIVE
Ketones, ur: NEGATIVE
Leukocytes,Ua: NEGATIVE
Nitrite: NEGATIVE
Protein, ur: NEGATIVE
Specific Gravity, Urine: 1.008 (ref 1.001–1.035)
pH: 8 (ref 5.0–8.0)

## 2021-01-21 LAB — URINE CULTURE
MICRO NUMBER:: 12297037
Result:: NO GROWTH
SPECIMEN QUALITY:: ADEQUATE

## 2021-01-23 ENCOUNTER — Other Ambulatory Visit: Payer: Self-pay

## 2021-01-23 ENCOUNTER — Ambulatory Visit: Payer: Medicare HMO | Admitting: Family Medicine

## 2021-01-23 ENCOUNTER — Ambulatory Visit (INDEPENDENT_AMBULATORY_CARE_PROVIDER_SITE_OTHER)
Admission: RE | Admit: 2021-01-23 | Discharge: 2021-01-23 | Disposition: A | Discharge status: Home / Self Care | Payer: Medicare HMO | Source: Ambulatory Visit | Attending: Family Medicine | Admitting: Family Medicine

## 2021-01-23 DIAGNOSIS — M545 Low back pain, unspecified: Secondary | ICD-10-CM

## 2021-01-25 ENCOUNTER — Other Ambulatory Visit: Payer: Self-pay | Admitting: Family Medicine

## 2021-02-09 DIAGNOSIS — H524 Presbyopia: Secondary | ICD-10-CM | POA: Diagnosis not present

## 2021-02-09 DIAGNOSIS — H52203 Unspecified astigmatism, bilateral: Secondary | ICD-10-CM | POA: Diagnosis not present

## 2021-02-09 DIAGNOSIS — H25013 Cortical age-related cataract, bilateral: Secondary | ICD-10-CM | POA: Diagnosis not present

## 2021-02-09 DIAGNOSIS — H2513 Age-related nuclear cataract, bilateral: Secondary | ICD-10-CM | POA: Diagnosis not present

## 2021-02-09 DIAGNOSIS — H5203 Hypermetropia, bilateral: Secondary | ICD-10-CM | POA: Diagnosis not present

## 2021-03-13 ENCOUNTER — Other Ambulatory Visit: Payer: Self-pay | Admitting: Family Medicine

## 2021-04-11 ENCOUNTER — Telehealth: Payer: Self-pay

## 2021-04-11 NOTE — Telephone Encounter (Signed)
Last OV for preventative care 02/05/20. Last AWV 12/29/20.  LVM requesting return call to schedule annual exam with PCP. Will also send mychart message.

## 2021-04-11 NOTE — Telephone Encounter (Signed)
Pt is sch for 06-14-2021. Pt is waiting on her new plan to start

## 2021-05-30 DIAGNOSIS — Z0100 Encounter for examination of eyes and vision without abnormal findings: Secondary | ICD-10-CM | POA: Diagnosis not present

## 2021-06-14 ENCOUNTER — Ambulatory Visit (INDEPENDENT_AMBULATORY_CARE_PROVIDER_SITE_OTHER): Payer: Medicare HMO | Admitting: Family Medicine

## 2021-06-14 ENCOUNTER — Encounter: Payer: Self-pay | Admitting: Family Medicine

## 2021-06-14 VITALS — BP 120/78 | HR 68 | Temp 98.7°F | Ht 60.0 in | Wt 131.5 lb

## 2021-06-14 DIAGNOSIS — Z0000 Encounter for general adult medical examination without abnormal findings: Secondary | ICD-10-CM

## 2021-06-14 DIAGNOSIS — Z23 Encounter for immunization: Secondary | ICD-10-CM

## 2021-06-14 DIAGNOSIS — E039 Hypothyroidism, unspecified: Secondary | ICD-10-CM

## 2021-06-14 DIAGNOSIS — I700 Atherosclerosis of aorta: Secondary | ICD-10-CM

## 2021-06-14 LAB — HEMOGLOBIN A1C: Hgb A1c MFr Bld: 5.3 % (ref 4.6–6.5)

## 2021-06-14 LAB — CBC WITH DIFFERENTIAL/PLATELET
Basophils Absolute: 0.1 10*3/uL (ref 0.0–0.1)
Basophils Relative: 2 % (ref 0.0–3.0)
Eosinophils Absolute: 0.4 10*3/uL (ref 0.0–0.7)
Eosinophils Relative: 8.7 % — ABNORMAL HIGH (ref 0.0–5.0)
HCT: 43 % (ref 36.0–46.0)
Hemoglobin: 14.4 g/dL (ref 12.0–15.0)
Lymphocytes Relative: 33.6 % (ref 12.0–46.0)
Lymphs Abs: 1.5 10*3/uL (ref 0.7–4.0)
MCHC: 33.5 g/dL (ref 30.0–36.0)
MCV: 93.8 fl (ref 78.0–100.0)
Monocytes Absolute: 0.5 10*3/uL (ref 0.1–1.0)
Monocytes Relative: 10.1 % (ref 3.0–12.0)
Neutro Abs: 2 10*3/uL (ref 1.4–7.7)
Neutrophils Relative %: 45.6 % (ref 43.0–77.0)
Platelets: 246 10*3/uL (ref 150.0–400.0)
RBC: 4.58 Mil/uL (ref 3.87–5.11)
RDW: 13.6 % (ref 11.5–15.5)
WBC: 4.5 10*3/uL (ref 4.0–10.5)

## 2021-06-14 LAB — LIPID PANEL
Cholesterol: 285 mg/dL — ABNORMAL HIGH (ref 0–200)
HDL: 44.7 mg/dL (ref >39.00)
NonHDL: 240.19
Total CHOL/HDL Ratio: 6
Triglycerides: 202 mg/dL — ABNORMAL HIGH (ref 0.0–149.0)
VLDL: 40.4 mg/dL — ABNORMAL HIGH (ref 0.0–40.0)

## 2021-06-14 LAB — LDL CHOLESTEROL, DIRECT: Direct LDL: 211 mg/dL

## 2021-06-14 LAB — BASIC METABOLIC PANEL
BUN: 9 mg/dL (ref 6–23)
CO2: 32 mEq/L (ref 19–32)
Calcium: 9.4 mg/dL (ref 8.4–10.5)
Chloride: 102 mEq/L (ref 96–112)
Creatinine, Ser: 0.81 mg/dL (ref 0.40–1.20)
GFR: 73.43 mL/min (ref >60.00)
Glucose, Bld: 82 mg/dL (ref 70–99)
Potassium: 4.2 mEq/L (ref 3.5–5.1)
Sodium: 140 mEq/L (ref 135–145)

## 2021-06-14 LAB — T4, FREE: Free T4: 0.72 ng/dL (ref 0.60–1.60)

## 2021-06-14 LAB — T3, FREE: T3, Free: 3 pg/mL (ref 2.3–4.2)

## 2021-06-14 LAB — HEPATIC FUNCTION PANEL
ALT: 15 U/L (ref 0–35)
AST: 20 U/L (ref 0–37)
Albumin: 4.1 g/dL (ref 3.5–5.2)
Alkaline Phosphatase: 41 U/L (ref 39–117)
Bilirubin, Direct: 0.1 mg/dL (ref 0.0–0.3)
Total Bilirubin: 0.5 mg/dL (ref 0.2–1.2)
Total Protein: 6.8 g/dL (ref 6.0–8.3)

## 2021-06-14 LAB — TSH: TSH: 0.97 u[IU]/mL (ref 0.35–5.50)

## 2021-06-14 MED ORDER — ROSUVASTATIN CALCIUM 10 MG PO TABS: 10.0000 mg | ORAL_TABLET | Freq: Every day | ORAL | 3 refills | Status: DC

## 2021-06-14 NOTE — Addendum Note (Signed)
Addended by: Wyvonne Lenz on: 06/14/2021 12:08 PM   Modules accepted: Orders

## 2021-06-14 NOTE — Progress Notes (Signed)
° °  Subjective:    Patient ID: Brandy Boyle, female    DOB: [birth year], 71 y.o.   MRN: [id number]  HPI Here for a well exam. She feels well. She enjoys yoga at [facility]. She was here on 01-23-21 for a pulled muscle, and Xrays of the lumbar spine were obtained. This showed a normal spine, but they did see calcifications in the aorta. She took a statin in years past but she stopped it several years ago.    Review of Systems  Constitutional: Negative.   HENT: Negative.    Eyes: Negative.   Respiratory: Negative.    Cardiovascular: Negative.   Gastrointestinal: Negative.   Genitourinary:  Negative for decreased urine volume, difficulty urinating, dyspareunia, dysuria, enuresis, flank pain, frequency, hematuria, pelvic pain and urgency.  Musculoskeletal: Negative.   Skin: Negative.   Neurological: Negative.  Negative for headaches.  Psychiatric/Behavioral: Negative.        Objective:   Physical Exam Constitutional:      General: She is not in acute distress.    Appearance: Normal appearance. She is well-developed.  HENT:     Head: Normocephalic and atraumatic.     Right Ear: External ear normal.     Left Ear: External ear normal.     Nose: Nose normal.     Mouth/Throat:     Pharynx: No oropharyngeal exudate.  Eyes:     General: No scleral icterus.    Conjunctiva/sclera: Conjunctivae normal.     Pupils: Pupils are equal, round, and reactive to light.  Neck:     Thyroid: No thyromegaly.     Vascular: No JVD.  Cardiovascular:     Rate and Rhythm: Normal rate and regular rhythm.     Heart sounds: Normal heart sounds. No murmur heard.   No friction rub. No gallop.  Pulmonary:     Effort: Pulmonary effort is normal. No respiratory distress.     Breath sounds: Normal breath sounds. No wheezing or rales.  Chest:     Chest wall: No tenderness.  Abdominal:     General: Bowel sounds are normal. There is no distension.     Palpations: Abdomen is soft. There is no mass.      Tenderness: There is no abdominal tenderness. There is no guarding or rebound.  Musculoskeletal:        General: No tenderness. Normal range of motion.     Cervical back: Normal range of motion and neck supple.  Lymphadenopathy:     Cervical: No cervical adenopathy.  Skin:    General: Skin is warm and dry.     Findings: No erythema or rash.  Neurological:     Mental Status: She is alert and oriented to person, place, and time.     Cranial Nerves: No cranial nerve deficit.     Motor: No abnormal muscle tone.     Coordination: Coordination normal.     Deep Tendon Reflexes: Reflexes are normal and symmetric. Reflexes normal.  Psychiatric:        Behavior: Behavior normal.        Thought Content: Thought content normal.        Judgment: Judgment normal.          Assessment & Plan:  Well exam. We discussed diet and exercise. Get fasting labs. Because of the aortic calcifications, we will start her back on Crestor 10 mg daily.  Alysia Penna, MD

## 2021-06-15 ENCOUNTER — Other Ambulatory Visit: Payer: Self-pay

## 2021-06-15 DIAGNOSIS — E782 Mixed hyperlipidemia: Secondary | ICD-10-CM

## 2021-06-15 MED ORDER — ROSUVASTATIN CALCIUM 40 MG PO TABS: 40.0000 mg | ORAL_TABLET | Freq: Every day | ORAL | 1 refills | Status: DC

## 2021-06-20 ENCOUNTER — Telehealth: Payer: Self-pay | Admitting: Family Medicine

## 2021-06-20 DIAGNOSIS — E782 Mixed hyperlipidemia: Secondary | ICD-10-CM

## 2021-06-20 MED ORDER — ROSUVASTATIN CALCIUM 40 MG PO TABS: 40.0000 mg | ORAL_TABLET | Freq: Every day | ORAL | 1 refills | Status: DC

## 2021-06-20 NOTE — Telephone Encounter (Signed)
Patient wants [facility] taken off her chart.  The Rosuvastatin needs to be pulled back from [facility] and sent in to [facility].

## 2021-06-20 NOTE — Telephone Encounter (Signed)
Rosuvastatin 10mg  sent to [place] per patient request.   Called patient to inform requested was complete.

## 2021-06-27 ENCOUNTER — Telehealth: Payer: Self-pay | Admitting: Family Medicine

## 2021-06-27 NOTE — Telephone Encounter (Signed)
Pt has new pharm and needs new rx levothyroxine (SYNTHROID) 50 MCG tablet  and DULoxetine (CYMBALTA) 30 MG capsule # 90 supply w/refills  [facility] - [place], [place] Phone:  [phone]  Fax:  [phone]

## 2021-06-28 MED ORDER — DULOXETINE HCL 30 MG PO CPEP: 30.0000 mg | ORAL_CAPSULE | Freq: Every day | ORAL | 3 refills | Status: DC

## 2021-06-28 MED ORDER — LEVOTHYROXINE SODIUM 50 MCG PO TABS: 50.0000 ug | ORAL_TABLET | Freq: Every day | ORAL | 3 refills | Status: DC

## 2021-06-28 NOTE — Telephone Encounter (Signed)
Rx sent 

## 2021-08-01 ENCOUNTER — Ambulatory Visit (INDEPENDENT_AMBULATORY_CARE_PROVIDER_SITE_OTHER): Payer: Medicare HMO | Admitting: Family Medicine

## 2021-08-01 ENCOUNTER — Encounter: Payer: Self-pay | Admitting: Family Medicine

## 2021-08-01 VITALS — BP 120/82 | HR 64 | Temp 99.0°F | Wt 133.4 lb

## 2021-08-01 DIAGNOSIS — E782 Mixed hyperlipidemia: Secondary | ICD-10-CM | POA: Diagnosis not present

## 2021-08-01 NOTE — Progress Notes (Signed)
? ?  Subjective:  ? ? Patient ID: Brandy Boyle, female    DOB: 04/17/1951, 71 y.o.   MRN: [id number] ? ?HPI ?Here to discuss side effects to her statin. She took Atorvastatin 80 mg daily for several years, but she had to deal with muscle aches and her LDL never got to goal anyway. Then she was tried on Rosuvastatin. She took 10 mg daily for a year or so, but she still had some muscle aches and her LDL a few weeks ago was still quite high at 211. Now for the past 4 weeks she has taken Rosuvastatin 40 mg daily and she had severe muscle cramps so she stopped taking it.  ? ? ?Review of Systems  ?Constitutional: Negative.   ?Respiratory: Negative.    ?Cardiovascular: Negative.   ?Musculoskeletal:  Positive for myalgias.  ? ?   ?Objective:  ? Physical Exam ?Constitutional:   ?   Appearance: Normal appearance.  ?Cardiovascular:  ?   Rate and Rhythm: Normal rate and regular rhythm.  ?   Pulses: Normal pulses.  ?   Heart sounds: Normal heart sounds.  ?Pulmonary:  ?   Effort: Pulmonary effort is normal.  ?   Breath sounds: Normal breath sounds.  ?Neurological:  ?   Mental Status: She is alert.  ? ? ? ? ? ?   ?Assessment & Plan:  ?Hyperlipidemia with statin intolerance. She will stay off the Rosuvastatin. We will refer her to the Lipid Clinic for further management.  ?Alysia Penna, MD ? ? ?

## 2021-09-20 DIAGNOSIS — D259 Leiomyoma of uterus, unspecified: Secondary | ICD-10-CM | POA: Diagnosis not present

## 2021-09-20 DIAGNOSIS — D251 Intramural leiomyoma of uterus: Secondary | ICD-10-CM | POA: Diagnosis not present

## 2021-09-20 DIAGNOSIS — E7800 Pure hypercholesterolemia, unspecified: Secondary | ICD-10-CM | POA: Diagnosis not present

## 2021-09-20 DIAGNOSIS — Z1231 Encounter for screening mammogram for malignant neoplasm of breast: Secondary | ICD-10-CM | POA: Diagnosis not present

## 2021-09-20 DIAGNOSIS — R87612 Low grade squamous intraepithelial lesion on cytologic smear of cervix (LGSIL): Secondary | ICD-10-CM | POA: Diagnosis not present

## 2021-09-20 DIAGNOSIS — E039 Hypothyroidism, unspecified: Secondary | ICD-10-CM | POA: Diagnosis not present

## 2021-09-20 DIAGNOSIS — Z6824 Body mass index (BMI) 24.0-24.9, adult: Secondary | ICD-10-CM | POA: Diagnosis not present

## 2021-09-20 LAB — HM MAMMOGRAPHY

## 2021-09-22 ENCOUNTER — Encounter: Payer: Self-pay | Admitting: Family Medicine

## 2021-11-15 ENCOUNTER — Other Ambulatory Visit: Payer: Self-pay | Admitting: Family Medicine

## 2021-12-26 ENCOUNTER — Telehealth: Payer: Self-pay | Admitting: Family Medicine

## 2021-12-26 NOTE — Telephone Encounter (Signed)
Dawn Can you look and see why patient was billed for AWV last year.  Her awv appt was 12/29/20.  Thank you

## 2021-12-26 NOTE — Telephone Encounter (Signed)
Pt called back to state that she is not interested in this appointment (AWV)  because she "got charged quite a bit last year for this type of visit"

## 2021-12-26 NOTE — Telephone Encounter (Signed)
Left message for patient to call back and schedule Medicare Annual Wellness Visit (AWV) either virtually or in office. Left  my Herbie Drape number [phone]   Last AWV ; 12/29/20 please schedule at anytime with [facility] Nurse Health Advisor 1 or 2

## 2022-01-02 DIAGNOSIS — M7061 Trochanteric bursitis, right hip: Secondary | ICD-10-CM | POA: Diagnosis not present

## 2022-01-23 ENCOUNTER — Ambulatory Visit (HOSPITAL_BASED_OUTPATIENT_CLINIC_OR_DEPARTMENT_OTHER): Payer: Medicare HMO | Admitting: Internal Medicine

## 2022-01-23 ENCOUNTER — Encounter (HOSPITAL_BASED_OUTPATIENT_CLINIC_OR_DEPARTMENT_OTHER): Payer: Self-pay | Admitting: Internal Medicine

## 2022-01-23 VITALS — BP 133/76 | HR 75 | Ht 60.0 in | Wt 136.0 lb

## 2022-01-23 DIAGNOSIS — Z8249 Family history of ischemic heart disease and other diseases of the circulatory system: Secondary | ICD-10-CM | POA: Diagnosis not present

## 2022-01-23 DIAGNOSIS — T466X5A Adverse effect of antihyperlipidemic and antiarteriosclerotic drugs, initial encounter: Secondary | ICD-10-CM | POA: Diagnosis not present

## 2022-01-23 DIAGNOSIS — M791 Myalgia, unspecified site: Secondary | ICD-10-CM | POA: Diagnosis not present

## 2022-01-23 DIAGNOSIS — E7849 Other hyperlipidemia: Secondary | ICD-10-CM | POA: Diagnosis not present

## 2022-01-23 MED ORDER — REPATHA SURECLICK 140 MG/ML ~~LOC~~ SOAJ: 1.0000 | SUBCUTANEOUS | 11 refills | Status: DC

## 2022-01-23 NOTE — Patient Instructions (Signed)
Medication Instructions:  Dr. Debara Pickett recommends Repatha '140mg'$ /mL (PCSK9). This is an injectable cholesterol medication self-administered once every 14 days. This medication will likely need prior approval with your insurance company, which we will work on. If the medication is not approved initially, we may need to do an appeal with your insurance.   Administer medication in area of fatty tissue such as abdomen, outer thigh, back of upper arm - and rotate site with each injection Store medication in refrigerator until ready to administer - allow to sit at room temp for 30 mins - 1 hour prior to injection Dispose of medication in a SHARPS container - your pharmacy should be able to direct you on this and proper disposal   If you need a co-pay card for Repatha: MicroLists.at If you need a co-pay card for Praluent: https://praluentpatientsupport.KnowRentals.uy  Patient Assistance:    These foundations have funds at various times.   The PAN Foundation: https://www.panfoundation.org/disease-funds/hypercholesterolemia/ -- can sign up for wait list  The [facility] offers assistance to help pay for medication copays.  They will cover copays for all cholesterol lowering meds, including statins, fibrates, omega-3 fish oils like Vascepa, ezetimibe, Repatha, Praluent, Nexletol, Nexlizet.  The cards are usually good for $2,500 or 12 months, whichever comes first. Go to healthwellfoundation.org Click on "Apply Now" Answer questions as to whom is applying (patient or representative) Your disease fund will be "hypercholesterolemia - Medicare access" They will ask questions about finances and which medications you are taking for cholesterol When you submit, the approval is usually within minutes.  You will need to print the card information from the site You will need to show this information to your pharmacy, they will bill your Medicare Part D plan first -then bill Health Well  --for the copay.   You can also call them at [phone], although the hold times can be quite long.     *If you need a refill on your cardiac medications before your next appointment, please call your pharmacy*   Lab Work: FASTING lab work to check cholesterol in 3-4 months -- complete about a week before your next visit with Dr. Debara Pickett   If you have labs (blood work) drawn today and your tests are completely normal, you will receive your results only by: [place] (if you have MyChart) OR A paper copy in the mail If you have any lab test that is abnormal or we need to change your treatment, we will call you to review the results.   Testing/Procedures: Dr. Debara Pickett has ordered a CT coronary calcium score.   Test locations:  [place]   This is $99 out of pocket.   Coronary CalciumScan A coronary calcium scan is an imaging test used to look for deposits of calcium and other fatty materials (plaques) in the inner lining of the blood vessels of the heart (coronary arteries). These deposits of calcium and plaques can partly clog and narrow the coronary arteries without producing any symptoms or warning signs. This puts a person at risk for a heart attack. This test can detect these deposits before symptoms develop. Tell a health care provider about: Any allergies you have. All medicines you are taking, including vitamins, herbs, eye drops, creams, and over-the-counter medicines. Any problems you or family members have had with anesthetic medicines. Any blood disorders you have. Any surgeries you have had. Any medical conditions you have. Whether you are pregnant or may be pregnant. What are the risks? Generally, this  is a safe procedure. However, problems may occur, including: Harm to a pregnant woman and her unborn baby. This test involves the use of radiation. Radiation exposure can be dangerous to a pregnant woman and her unborn baby. If you  are pregnant, you generally should not have this procedure done. Slight increase in the risk of cancer. This is because of the radiation involved in the test. What happens before the procedure? No preparation is needed for this procedure. What happens during the procedure? You will undress and remove any jewelry around your neck or chest. You will put on a hospital gown. Sticky electrodes will be placed on your chest. The electrodes will be connected to an electrocardiogram (ECG) machine to record a tracing of the electrical activity of your heart. A CT scanner will take pictures of your heart. During this time, you will be asked to lie still and hold your breath for 2-3 seconds while a picture of your heart is being taken. The procedure may vary among health care providers and hospitals. What happens after the procedure? You can get dressed. You can return to your normal activities. It is up to you to get the results of your test. Ask your health care provider, or the department that is doing the test, when your results will be ready. Summary A coronary calcium scan is an imaging test used to look for deposits of calcium and other fatty materials (plaques) in the inner lining of the blood vessels of the heart (coronary arteries). Generally, this is a safe procedure. Tell your health care provider if you are pregnant or may be pregnant. No preparation is needed for this procedure. A CT scanner will take pictures of your heart. You can return to your normal activities after the scan is done. This information is not intended to replace advice given to you by your health care provider. Make sure you discuss any questions you have with your health care provider. Document Released: 11/10/2007 Document Revised: 04/02/2016 Document Reviewed: 04/02/2016 Elsevier Interactive Patient Education  2017 [place]: At [facility], you and your health needs are our priority.   As part of our continuing mission to provide you with exceptional heart care, we have created designated Provider Care Teams.  These Care Teams include your primary Cardiologist (physician) and Advanced Practice Providers (APPs -  Physician Assistants and Nurse Practitioners) who all work together to provide you with the care you need, when you need it.  We recommend signing up for the patient portal called "MyChart".  Sign up information is provided on this After Visit Summary.  MyChart is used to connect with patients for Virtual Visits (Telemedicine).  Patients are able to view lab/test results, encounter notes, upcoming appointments, etc.  Non-urgent messages can be sent to your provider as well.   To learn more about what you can do with MyChart, go to [email or web address].    Your next appointment:    3-4 months with Dr. Debara Pickett -- lipid clinic

## 2022-01-23 NOTE — Progress Notes (Signed)
LIPID CLINIC CONSULT NOTE  Chief Complaint:  Manage dyslipidemia  Primary Care Physician: Brandy Morale, MD  Primary Cardiologist:  None  HPI:  Brandy Boyle is a 71 y.o. female who is being seen today for the evaluation of dyslipidemia at the request of Brandy Morale, MD. this is a pleasant 71 year old female coming referred for evaluation management of dyslipidemia.  She reports longstanding issues with high cholesterol and most recently had a lipid profile which showed total cholesterol 285, HDL 44, triglycerides 202 and LDL 211.  She reports early onset heart disease in her family including her father who had an MI in his 11s and her mom who had a stroke at 73 I believe.  Unfortunately, she has not been able to tolerate statins due to significant myalgias.  She reports trying to eat lower saturated fat diet.  PMHx:  Past Medical History:  Diagnosis Date   Anxiety    Arthritis    Bloating    Cyclic vomiting syndrome - suspected 10/13/2014   Depression    GERD (gastroesophageal reflux disease)    Helicobacter pylori gastritis 09/09/2014   History of epigastric pain    Hyperlipidemia    Hypothyroidism    Migraine headache with aura    Nausea & vomiting    chronic   Osteopenia    last DEXA 01-28-08   Vitreous tug syndrome    sees Dr. Sherlynn Boyle     Past Surgical History:  Procedure Laterality Date   COLONOSCOPY  03-18-14   per Dr. Carlean Purl, diverticulosis only, repeat in 10 years   ESOPHAGOGASTRODUODENOSCOPY  09-06-14   per Dr. Carlean Purl, had H pylori gastr   TONSILLECTOMY  1958    FAMHx:  Family History  Problem Relation Age of Onset   Cholecystitis Sister        sister had  cholectectomy   Colon cancer Paternal Grandmother 17   Heart disease Father    Pancreatic cancer Cousin    Alcohol abuse Other        Family Hx   Arthritis Other        Family Hx   Breast cancer Other        1st degree  relative <50   Diabetes Other        1st degree relative    Hypertension Other        Family History    Stroke Other        Family Hx 1st degree <50   Heart disease Other        Family Hx   Esophageal cancer Neg Hx    Rectal cancer Neg Hx    Stomach cancer Neg Hx     SOCHx:   reports that she has never smoked. She has never used smokeless tobacco. She reports that she does not drink alcohol and does not use drugs.  ALLERGIES:  No Known Allergies  ROS: Pertinent items noted in HPI and remainder of comprehensive ROS otherwise negative.  HOME MEDS: Current Outpatient Medications on File Prior to Visit  Medication Sig Dispense Refill   acetaminophen (TYLENOL) 500 MG tablet Take 1,000 mg by mouth every 6 (six) hours as needed for mild pain, moderate pain or headache.     Calcium Carbonate-Vitamin D (CALCIUM + D PO) Take by mouth daily.     clonazePAM (KLONOPIN) 1 MG tablet Take 1 tablet (1 mg total) by mouth 3 (three) times daily as needed. for anxiety 270 tablet 1  DULoxetine (CYMBALTA) 30 MG capsule Take 1 capsule (30 mg total) by mouth daily. 90 capsule 3   estradiol (ESTRACE) 0.1 MG/GM vaginal cream Place vaginally.     levothyroxine (SYNTHROID) 50 MCG tablet Take 1 tablet (50 mcg total) by mouth daily. 90 tablet 3   Multiple Vitamins-Minerals (MULTIVITAMIN WITH MINERALS) tablet Take 1 tablet by mouth daily.     [DISCONTINUED] omeprazole (PRILOSEC) 40 MG capsule Take 1 capsule (40 mg total) by mouth daily. (Patient not taking: Reported on 08/26/2014) 30 capsule 11   No current facility-administered medications on file prior to visit.    LABS/IMAGING: No results found for this or any previous visit (from the past 48 hour(s)). No results found.  LIPID PANEL:    Component Value Date/Time   CHOL 285 (H) 06/14/2021 1140   TRIG 202.0 (H) 06/14/2021 1140   HDL 44.70 06/14/2021 1140   CHOLHDL 6 06/14/2021 1140   VLDL 40.4 (H) 06/14/2021 1140   LDLCALC 200 (H) 02/05/2020 1353   LDLDIRECT 211.0 06/14/2021 1140    WEIGHTS: Wt Readings  from Last 3 Encounters:  01/23/22 136 lb (61.7 kg)  08/01/21 133 lb 6 oz (60.5 kg)  06/14/21 131 lb 8 oz (59.6 kg)    VITALS: BP 133/76   Pulse 75   Ht 5' (1.524 m)   Wt 136 lb (61.7 kg)   LMP 03/23/2005   BMI 26.56 kg/m   EXAM: General appearance: alert and no distress Neck: no carotid bruit, no JVD, and thyroid not enlarged, symmetric, no tenderness/mass/nodules Lungs: clear to auscultation bilaterally Heart: regular rate and rhythm, S1, S2 normal, no murmur, click, rub or gallop Abdomen: soft, non-tender; bowel sounds normal; no masses,  no organomegaly Extremities: extremities normal, atraumatic, no cyanosis or edema Pulses: 2+ and symmetric Skin: Skin color, texture, turgor normal. 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   [name] has had intolerance of statins over the years to multiple medications which cause muscle aches.  Her cholesterol is quite high suggestive of a familial hyperlipidemia.  She is interested as to whether she may have developed coronary artery disease and I would recommend a calcium score.  She will need therapy and is a good candidate for PCSK9 inhibitor given the high likelihood that she has a familial hyperlipidemia.  Goal is to lower cholesterol by at least 50% and hopefully target LDL to less than 70.  We will plan repeat labs including a lipid NMR and LP(a) in about 3 to 4 months and follow-up with me at that time.  Thanks again for the kind referral.  Pixie Casino, MD, FACC, [place] Director of the [facility]  [facility] Diplomate of the American Board of Clinical Lipidology Attending Cardiologist  Direct Dial:  [phone]  Fax: [phone]  Website:  [email or web address]  Nadean Corwin [facility] 01/23/2022, 2:15 PM

## 2022-01-24 ENCOUNTER — Telehealth: Payer: Self-pay

## 2022-01-24 NOTE — Telephone Encounter (Signed)
PA  Case: [id number], Key: [id number], submitted and approved Coverage Starts on: 05/28/2021 12:00:00 AM, Coverage Ends on: 05/27/2022 12:00:00 AM.

## 2022-01-31 ENCOUNTER — Ambulatory Visit (HOSPITAL_BASED_OUTPATIENT_CLINIC_OR_DEPARTMENT_OTHER)
Admission: RE | Admit: 2022-01-31 | Discharge: 2022-01-31 | Disposition: A | Discharge status: Home / Self Care | Payer: Medicare HMO | Source: Ambulatory Visit | Attending: Internal Medicine | Admitting: Internal Medicine

## 2022-01-31 DIAGNOSIS — E7849 Other hyperlipidemia: Secondary | ICD-10-CM

## 2022-03-08 DIAGNOSIS — H5203 Hypermetropia, bilateral: Secondary | ICD-10-CM | POA: Diagnosis not present

## 2022-03-08 DIAGNOSIS — H52203 Unspecified astigmatism, bilateral: Secondary | ICD-10-CM | POA: Diagnosis not present

## 2022-03-08 DIAGNOSIS — H25813 Combined forms of age-related cataract, bilateral: Secondary | ICD-10-CM | POA: Diagnosis not present

## 2022-03-08 DIAGNOSIS — H524 Presbyopia: Secondary | ICD-10-CM | POA: Diagnosis not present

## 2022-04-18 ENCOUNTER — Other Ambulatory Visit: Payer: Self-pay | Admitting: Family Medicine

## 2022-04-27 ENCOUNTER — Ambulatory Visit (HOSPITAL_BASED_OUTPATIENT_CLINIC_OR_DEPARTMENT_OTHER): Payer: Medicare HMO | Admitting: Internal Medicine

## 2022-06-01 ENCOUNTER — Other Ambulatory Visit (HOSPITAL_COMMUNITY): Payer: Self-pay

## 2022-06-20 ENCOUNTER — Telehealth: Payer: Self-pay

## 2022-06-20 ENCOUNTER — Other Ambulatory Visit (HOSPITAL_COMMUNITY): Payer: Self-pay

## 2022-06-20 NOTE — Telephone Encounter (Signed)
Pharmacy Patient Advocate Encounter  Prior Authorization for REPATHA 140 MG/ML INJ has been approved.    Effective dates: 06/20/22 through 05/28/23  Received notification from [facility] that prior authorization for REPATHA 140 MG/ML INJ is needed.    PA submitted on 06/20/22 Key [id number] Status is pending  Karie Soda, [place] Patient Advocate Specialist Direct Number: [phone] Fax: [phone]

## 2022-07-19 ENCOUNTER — Telehealth: Payer: Self-pay | Admitting: Family Medicine

## 2022-07-19 NOTE — Telephone Encounter (Signed)
Prescription Request  07/19/2022  Is this a "Controlled Substance" medicine? yes  LOV: 08/01/2021  What is the name of the medication or equipment? clonazePAM (KLONOPIN) 1 MG tablet   Have you contacted your pharmacy to request a refill? No   Which pharmacy would you like this sent to?  [place], [place] [address] Phone: [phone] Fax: [phone]    Patient notified that their request is being sent to the clinical staff for review and that they should receive a response within 2 business days.   Please advise at Mobile [phone] (mobile)

## 2022-07-20 NOTE — Telephone Encounter (Signed)
Last refill-02/05/2020* Last OV-08/01/2021  Next OV-07/27/2022

## 2022-07-23 ENCOUNTER — Other Ambulatory Visit: Payer: Self-pay | Admitting: Family Medicine

## 2022-07-23 MED ORDER — CLONAZEPAM 1 MG PO TABS: 1.0000 mg | ORAL_TABLET | Freq: Three times a day (TID) | ORAL | 1 refills | Status: DC | PRN

## 2022-07-23 NOTE — Telephone Encounter (Signed)
Done

## 2022-07-27 ENCOUNTER — Encounter: Payer: Self-pay | Admitting: Family Medicine

## 2022-07-27 ENCOUNTER — Ambulatory Visit (INDEPENDENT_AMBULATORY_CARE_PROVIDER_SITE_OTHER): Payer: Medicare HMO | Admitting: Family Medicine

## 2022-07-27 VITALS — BP 126/74 | HR 76 | Temp 98.8°F | Ht 59.5 in | Wt 134.4 lb

## 2022-07-27 DIAGNOSIS — M949 Disorder of cartilage, unspecified: Secondary | ICD-10-CM | POA: Diagnosis not present

## 2022-07-27 DIAGNOSIS — E039 Hypothyroidism, unspecified: Secondary | ICD-10-CM

## 2022-07-27 DIAGNOSIS — E782 Mixed hyperlipidemia: Secondary | ICD-10-CM | POA: Diagnosis not present

## 2022-07-27 DIAGNOSIS — M8580 Other specified disorders of bone density and structure, unspecified site: Secondary | ICD-10-CM | POA: Diagnosis not present

## 2022-07-27 DIAGNOSIS — R1115 Cyclical vomiting syndrome unrelated to migraine: Secondary | ICD-10-CM | POA: Diagnosis not present

## 2022-07-27 DIAGNOSIS — K219 Gastro-esophageal reflux disease without esophagitis: Secondary | ICD-10-CM | POA: Diagnosis not present

## 2022-07-27 DIAGNOSIS — M899 Disorder of bone, unspecified: Secondary | ICD-10-CM | POA: Diagnosis not present

## 2022-07-27 DIAGNOSIS — F418 Other specified anxiety disorders: Secondary | ICD-10-CM

## 2022-07-27 DIAGNOSIS — R739 Hyperglycemia, unspecified: Secondary | ICD-10-CM

## 2022-07-27 DIAGNOSIS — R69 Illness, unspecified: Secondary | ICD-10-CM | POA: Diagnosis not present

## 2022-07-27 LAB — LIPID PANEL
Cholesterol: 226 mg/dL — ABNORMAL HIGH (ref 0–200)
HDL: 47.1 mg/dL (ref >39.00)
LDL Cholesterol: 146 mg/dL — ABNORMAL HIGH (ref 0–99)
NonHDL: 178.96
Total CHOL/HDL Ratio: 5
Triglycerides: 163 mg/dL — ABNORMAL HIGH (ref 0.0–149.0)
VLDL: 32.6 mg/dL (ref 0.0–40.0)

## 2022-07-27 LAB — CBC WITH DIFFERENTIAL/PLATELET
Basophils Absolute: 0.1 10*3/uL (ref 0.0–0.1)
Basophils Relative: 1.1 % (ref 0.0–3.0)
Eosinophils Absolute: 0.6 10*3/uL (ref 0.0–0.7)
Eosinophils Relative: 10 % — ABNORMAL HIGH (ref 0.0–5.0)
HCT: 44 % (ref 36.0–46.0)
Hemoglobin: 15 g/dL (ref 12.0–15.0)
Lymphocytes Relative: 27.2 % (ref 12.0–46.0)
Lymphs Abs: 1.7 10*3/uL (ref 0.7–4.0)
MCHC: 34.2 g/dL (ref 30.0–36.0)
MCV: 92.9 fl (ref 78.0–100.0)
Monocytes Absolute: 0.5 10*3/uL (ref 0.1–1.0)
Monocytes Relative: 8.4 % (ref 3.0–12.0)
Neutro Abs: 3.3 10*3/uL (ref 1.4–7.7)
Neutrophils Relative %: 53.3 % (ref 43.0–77.0)
Platelets: 224 10*3/uL (ref 150.0–400.0)
RBC: 4.74 Mil/uL (ref 3.87–5.11)
RDW: 13.4 % (ref 11.5–15.5)
WBC: 6.2 10*3/uL (ref 4.0–10.5)

## 2022-07-27 LAB — BASIC METABOLIC PANEL
BUN: 12 mg/dL (ref 6–23)
CO2: 30 mEq/L (ref 19–32)
Calcium: 9.5 mg/dL (ref 8.4–10.5)
Chloride: 103 mEq/L (ref 96–112)
Creatinine, Ser: 0.86 mg/dL (ref 0.40–1.20)
GFR: 67.8 mL/min (ref >60.00)
Glucose, Bld: 82 mg/dL (ref 70–99)
Potassium: 4.1 mEq/L (ref 3.5–5.1)
Sodium: 140 mEq/L (ref 135–145)

## 2022-07-27 LAB — HEPATIC FUNCTION PANEL
ALT: 12 U/L (ref 0–35)
AST: 19 U/L (ref 0–37)
Albumin: 3.9 g/dL (ref 3.5–5.2)
Alkaline Phosphatase: 47 U/L (ref 39–117)
Bilirubin, Direct: 0.1 mg/dL (ref 0.0–0.3)
Total Bilirubin: 0.6 mg/dL (ref 0.2–1.2)
Total Protein: 6.6 g/dL (ref 6.0–8.3)

## 2022-07-27 LAB — T3, FREE: T3, Free: 2.7 pg/mL (ref 2.3–4.2)

## 2022-07-27 LAB — T4, FREE: Free T4: 0.68 ng/dL (ref 0.60–1.60)

## 2022-07-27 LAB — HEMOGLOBIN A1C: Hgb A1c MFr Bld: 5.3 % (ref 4.6–6.5)

## 2022-07-27 LAB — TSH: TSH: 1.08 u[IU]/mL (ref 0.35–5.50)

## 2022-07-27 NOTE — Progress Notes (Signed)
   Subjective:    Patient ID: Brandy Boyle, female    DOB: 1951/04/15, 72 y.o.   MRN: [id number]  HPI Here to follow up on issues. She feels well in general. Her last DEXA was 2 years ago. She eats a healthy diet. Her GERD is stable. Her depression with anxiety is stable.    Review of Systems  Constitutional: Negative.   HENT: Negative.    Eyes: Negative.   Respiratory: Negative.    Cardiovascular: Negative.   Gastrointestinal: Negative.   Genitourinary:  Negative for decreased urine volume, difficulty urinating, dyspareunia, dysuria, enuresis, flank pain, frequency, hematuria, pelvic pain and urgency.  Musculoskeletal: Negative.   Skin: Negative.   Neurological: Negative.  Negative for headaches.  Psychiatric/Behavioral: Negative.         Objective:   Physical Exam Constitutional:      General: She is not in acute distress.    Appearance: Normal appearance. She is well-developed.  HENT:     Head: Normocephalic and atraumatic.     Right Ear: External ear normal.     Left Ear: External ear normal.     Nose: Nose normal.     Mouth/Throat:     Pharynx: No oropharyngeal exudate.  Eyes:     General: No scleral icterus.    Conjunctiva/sclera: Conjunctivae normal.     Pupils: Pupils are equal, round, and reactive to light.  Neck:     Thyroid: No thyromegaly.     Vascular: No JVD.  Cardiovascular:     Rate and Rhythm: Normal rate and regular rhythm.     Heart sounds: Normal heart sounds. No murmur heard.    No friction rub. No gallop.  Pulmonary:     Effort: Pulmonary effort is normal. No respiratory distress.     Breath sounds: Normal breath sounds. No wheezing or rales.  Chest:     Chest wall: No tenderness.  Abdominal:     General: Bowel sounds are normal. There is no distension.     Palpations: Abdomen is soft. There is no mass.     Tenderness: There is no abdominal tenderness. There is no guarding or rebound.  Musculoskeletal:        General: No tenderness.  Normal range of motion.     Cervical back: Normal range of motion and neck supple.  Lymphadenopathy:     Cervical: No cervical adenopathy.  Skin:    General: Skin is warm and dry.     Findings: No erythema or rash.  Neurological:     Mental Status: She is alert and oriented to person, place, and time.     Cranial Nerves: No cranial nerve deficit.     Motor: No abnormal muscle tone.     Coordination: Coordination normal.     Deep Tendon Reflexes: Reflexes are normal and symmetric. Reflexes normal.  Psychiatric:        Behavior: Behavior normal.        Thought Content: Thought content normal.        Judgment: Judgment normal.           Assessment & Plan:  Her depression and anxiety are doing well. Her DEXA is stable. We will get fasting labs to check lipids, a thyroid panel, etc. Set up another DEXA for the osteopenia. We spent a total of (32   ) minutes reviewing records and discussing these issues.  Alysia Penna, MD

## 2022-08-08 NOTE — Telephone Encounter (Signed)
Patient is no longer using [facility] would like prescription clonazePAM (KLONOPIN) 1 MG tablet redirected to [place], [place] - [place] [address] Phone: [phone]  Fax: [phone]

## 2022-08-09 MED ORDER — CLONAZEPAM 1 MG PO TABS: 1.0000 mg | ORAL_TABLET | Freq: Three times a day (TID) | ORAL | 1 refills | Status: DC | PRN

## 2022-08-09 NOTE — Telephone Encounter (Signed)
Pt.notified

## 2022-08-09 NOTE — Addendum Note (Signed)
Addended by: Alysia Penna A on: 08/09/2022 08:31 AM   Modules accepted: Orders

## 2022-08-09 NOTE — Telephone Encounter (Signed)
Done

## 2023-01-08 DIAGNOSIS — Z01419 Encounter for gynecological examination (general) (routine) without abnormal findings: Secondary | ICD-10-CM | POA: Diagnosis not present

## 2023-01-08 DIAGNOSIS — N952 Postmenopausal atrophic vaginitis: Secondary | ICD-10-CM | POA: Diagnosis not present

## 2023-01-08 DIAGNOSIS — Z124 Encounter for screening for malignant neoplasm of cervix: Secondary | ICD-10-CM | POA: Diagnosis not present

## 2023-01-08 DIAGNOSIS — Z01411 Encounter for gynecological examination (general) (routine) with abnormal findings: Secondary | ICD-10-CM | POA: Diagnosis not present

## 2023-01-08 DIAGNOSIS — Z6825 Body mass index (BMI) 25.0-25.9, adult: Secondary | ICD-10-CM | POA: Diagnosis not present

## 2023-01-08 DIAGNOSIS — M899 Disorder of bone, unspecified: Secondary | ICD-10-CM | POA: Diagnosis not present

## 2023-01-08 DIAGNOSIS — Z1231 Encounter for screening mammogram for malignant neoplasm of breast: Secondary | ICD-10-CM | POA: Diagnosis not present

## 2023-01-08 DIAGNOSIS — N393 Stress incontinence (female) (male): Secondary | ICD-10-CM | POA: Diagnosis not present

## 2023-01-08 LAB — HM MAMMOGRAPHY

## 2023-02-09 DIAGNOSIS — R030 Elevated blood-pressure reading, without diagnosis of hypertension: Secondary | ICD-10-CM | POA: Diagnosis not present

## 2023-02-09 DIAGNOSIS — T2120XA Burn of second degree of trunk, unspecified site, initial encounter: Secondary | ICD-10-CM | POA: Diagnosis not present

## 2023-02-11 ENCOUNTER — Telehealth: Payer: Self-pay | Admitting: Family Medicine

## 2023-02-11 NOTE — Telephone Encounter (Signed)
Prescription Request  02/11/2023  LOV: 07/27/2022  What is the name of the medication or equipment?   DULoxetine (CYMBALTA) 30 MG capsule  Levothyroxine (SYNTHROID) 50 MCG tablet  Pt states mail service will take too long.  Have you contacted your pharmacy to request a refill? No   Which pharmacy would you like this sent to?  [id number] -  [place], [place] - [address] [[id number]]   Patient notified that their request is being sent to the clinical staff for review and that they should receive a response within 2 business days.   Please advise at Mobile [phone] (mobile)

## 2023-02-11 NOTE — Telephone Encounter (Signed)
Pt is aware can take up to 3 business day

## 2023-02-12 ENCOUNTER — Other Ambulatory Visit: Payer: Self-pay

## 2023-02-12 MED ORDER — LEVOTHYROXINE SODIUM 50 MCG PO TABS: 50.0000 ug | ORAL_TABLET | Freq: Every day | ORAL | 0 refills | Status: DC

## 2023-02-12 MED ORDER — DULOXETINE HCL 30 MG PO CPEP: 30.0000 mg | ORAL_CAPSULE | Freq: Every day | ORAL | 0 refills | Status: DC

## 2023-02-12 NOTE — Telephone Encounter (Signed)
Sent!

## 2023-05-02 ENCOUNTER — Other Ambulatory Visit: Payer: Self-pay | Admitting: Family Medicine

## 2023-05-03 NOTE — Telephone Encounter (Signed)
Pt LOV was 07/27/2022

## 2023-06-11 DIAGNOSIS — H5203 Hypermetropia, bilateral: Secondary | ICD-10-CM | POA: Diagnosis not present

## 2023-06-11 DIAGNOSIS — H52203 Unspecified astigmatism, bilateral: Secondary | ICD-10-CM | POA: Diagnosis not present

## 2023-06-11 DIAGNOSIS — H43813 Vitreous degeneration, bilateral: Secondary | ICD-10-CM | POA: Diagnosis not present

## 2023-06-11 DIAGNOSIS — H25012 Cortical age-related cataract, left eye: Secondary | ICD-10-CM | POA: Diagnosis not present

## 2023-06-11 DIAGNOSIS — H2513 Age-related nuclear cataract, bilateral: Secondary | ICD-10-CM | POA: Diagnosis not present

## 2023-07-30 ENCOUNTER — Ambulatory Visit (INDEPENDENT_AMBULATORY_CARE_PROVIDER_SITE_OTHER): Payer: Medicare HMO | Admitting: Family Medicine

## 2023-07-30 ENCOUNTER — Encounter: Payer: Self-pay | Admitting: Family Medicine

## 2023-07-30 VITALS — BP 100/70 | HR 83 | Temp 98.2°F | Ht 59.5 in | Wt 132.2 lb

## 2023-07-30 DIAGNOSIS — F418 Other specified anxiety disorders: Secondary | ICD-10-CM | POA: Diagnosis not present

## 2023-07-30 DIAGNOSIS — R739 Hyperglycemia, unspecified: Secondary | ICD-10-CM | POA: Diagnosis not present

## 2023-07-30 DIAGNOSIS — M8580 Other specified disorders of bone density and structure, unspecified site: Secondary | ICD-10-CM

## 2023-07-30 DIAGNOSIS — E039 Hypothyroidism, unspecified: Secondary | ICD-10-CM

## 2023-07-30 DIAGNOSIS — R1115 Cyclical vomiting syndrome unrelated to migraine: Secondary | ICD-10-CM | POA: Diagnosis not present

## 2023-07-30 DIAGNOSIS — K219 Gastro-esophageal reflux disease without esophagitis: Secondary | ICD-10-CM

## 2023-07-30 DIAGNOSIS — E782 Mixed hyperlipidemia: Secondary | ICD-10-CM | POA: Diagnosis not present

## 2023-07-30 LAB — CBC WITH DIFFERENTIAL/PLATELET
Basophils Absolute: 0.1 10*3/uL (ref 0.0–0.1)
Basophils Relative: 1.3 % (ref 0.0–3.0)
Eosinophils Absolute: 0.3 10*3/uL (ref 0.0–0.7)
Eosinophils Relative: 7.3 % — ABNORMAL HIGH (ref 0.0–5.0)
HCT: 43.2 % (ref 36.0–46.0)
Hemoglobin: 14.6 g/dL (ref 12.0–15.0)
Lymphocytes Relative: 39.6 % (ref 12.0–46.0)
Lymphs Abs: 1.8 10*3/uL (ref 0.7–4.0)
MCHC: 33.9 g/dL (ref 30.0–36.0)
MCV: 92.5 fl (ref 78.0–100.0)
Monocytes Absolute: 0.4 10*3/uL (ref 0.1–1.0)
Monocytes Relative: 8.1 % (ref 3.0–12.0)
Neutro Abs: 1.9 10*3/uL (ref 1.4–7.7)
Neutrophils Relative %: 43.7 % (ref 43.0–77.0)
Platelets: 242 10*3/uL (ref 150.0–400.0)
RBC: 4.67 Mil/uL (ref 3.87–5.11)
RDW: 13.9 % (ref 11.5–15.5)
WBC: 4.4 10*3/uL (ref 4.0–10.5)

## 2023-07-30 LAB — HEPATIC FUNCTION PANEL
ALT: 14 U/L (ref 0–35)
AST: 20 U/L (ref 0–37)
Albumin: 4.3 g/dL (ref 3.5–5.2)
Alkaline Phosphatase: 46 U/L (ref 39–117)
Bilirubin, Direct: 0.1 mg/dL (ref 0.0–0.3)
Total Bilirubin: 0.6 mg/dL (ref 0.2–1.2)
Total Protein: 6.8 g/dL (ref 6.0–8.3)

## 2023-07-30 LAB — LIPID PANEL
Cholesterol: 293 mg/dL — ABNORMAL HIGH (ref 0–200)
HDL: 56.4 mg/dL (ref >39.00)
LDL Cholesterol: 206 mg/dL — ABNORMAL HIGH (ref 0–99)
NonHDL: 236.8
Total CHOL/HDL Ratio: 5
Triglycerides: 156 mg/dL — ABNORMAL HIGH (ref 0.0–149.0)
VLDL: 31.2 mg/dL (ref 0.0–40.0)

## 2023-07-30 LAB — BASIC METABOLIC PANEL
BUN: 8 mg/dL (ref 6–23)
CO2: 30 meq/L (ref 19–32)
Calcium: 9.6 mg/dL (ref 8.4–10.5)
Chloride: 103 meq/L (ref 96–112)
Creatinine, Ser: 0.72 mg/dL (ref 0.40–1.20)
GFR: 83.32 mL/min (ref >60.00)
Glucose, Bld: 94 mg/dL (ref 70–99)
Potassium: 3.9 meq/L (ref 3.5–5.1)
Sodium: 140 meq/L (ref 135–145)

## 2023-07-30 LAB — TSH: TSH: 0.66 u[IU]/mL (ref 0.35–5.50)

## 2023-07-30 LAB — T3, FREE: T3, Free: 3.4 pg/mL (ref 2.3–4.2)

## 2023-07-30 LAB — HEMOGLOBIN A1C: Hgb A1c MFr Bld: 5.4 % (ref 4.6–6.5)

## 2023-07-30 LAB — T4, FREE: Free T4: 0.82 ng/dL (ref 0.60–1.60)

## 2023-07-30 MED ORDER — LEVOTHYROXINE SODIUM 50 MCG PO TABS: 50.0000 ug | ORAL_TABLET | Freq: Every day | ORAL | 3 refills | Status: AC

## 2023-07-30 NOTE — Progress Notes (Signed)
 Subjective:    Patient ID: Brandy Boyle, female    DOB: 04/13/1951, 73 y.o.   MRN: [id number]  HPI Here to follow up on issues. She is concerned because she has fallen twice in the past 2 months. She had no serious injuries. The first time she was stepping up from a parking lot to a curb, and the heel of her shoe caught the curb so she fell forward. The other time she was coming down a flight of steps when her ankle rolled, causing her to fall. Otherwise her GERD is stable. Her depression and anxiety are stable. She has been living with her son for 3 years, but she is about to move into her own condo, so she is excited .   Review of Systems  Constitutional: Negative.   HENT: Negative.    Eyes: Negative.   Respiratory: Negative.    Cardiovascular: Negative.   Gastrointestinal: Negative.   Genitourinary:  Negative for decreased urine volume, difficulty urinating, dyspareunia, dysuria, enuresis, flank pain, frequency, hematuria, pelvic pain and urgency.  Musculoskeletal: Negative.   Skin: Negative.   Neurological: Negative.  Negative for headaches.  Psychiatric/Behavioral: Negative.         Objective:   Physical Exam Constitutional:      General: She is not in acute distress.    Appearance: Normal appearance. She is well-developed.  HENT:     Head: Normocephalic and atraumatic.     Right Ear: External ear normal.     Left Ear: External ear normal.     Nose: Nose normal.     Mouth/Throat:     Pharynx: No oropharyngeal exudate.  Eyes:     General: No scleral icterus.    Conjunctiva/sclera: Conjunctivae normal.     Pupils: Pupils are equal, round, and reactive to light.  Neck:     Thyroid: No thyromegaly.     Vascular: No JVD.  Cardiovascular:     Rate and Rhythm: Normal rate and regular rhythm.     Pulses: Normal pulses.     Heart sounds: Normal heart sounds. No murmur heard.    No friction rub. No gallop.  Pulmonary:     Effort: Pulmonary effort is normal. No  respiratory distress.     Breath sounds: Normal breath sounds. No wheezing or rales.  Chest:     Chest wall: No tenderness.  Abdominal:     General: Bowel sounds are normal. There is no distension.     Palpations: Abdomen is soft. There is no mass.     Tenderness: There is no abdominal tenderness. There is no guarding or rebound.  Musculoskeletal:        General: No tenderness. Normal range of motion.     Cervical back: Normal range of motion and neck supple.  Lymphadenopathy:     Cervical: No cervical adenopathy.  Skin:    General: Skin is warm and dry.     Findings: No erythema or rash.  Neurological:     General: No focal deficit present.     Mental Status: She is alert and oriented to person, place, and time.     Cranial Nerves: No cranial nerve deficit.     Motor: No abnormal muscle tone.     Coordination: Coordination normal.     Deep Tendon Reflexes: Reflexes are normal and symmetric. Reflexes normal.  Psychiatric:        Mood and Affect: Mood normal.        Behavior: Behavior normal.  Thought Content: Thought content normal.        Judgment: Judgment normal.           Assessment & Plan:  I told her not to worry about the falls because there was a clear mechanical reason why they occurred. Her depression and anxiety are stable. Her GERD is stable. We will get fasting labs to check a thyroid panel, lipids, etc. We spent a total of ( 34  ) minutes reviewing records and discussing these issues.  Gershon Crane, MD  Gershon Crane, MD

## 2023-07-31 ENCOUNTER — Encounter: Payer: Self-pay | Admitting: *Deleted

## 2023-08-02 NOTE — Telephone Encounter (Signed)
 Relayed lab results to pt

## 2023-11-20 DIAGNOSIS — Z0100 Encounter for examination of eyes and vision without abnormal findings: Secondary | ICD-10-CM | POA: Diagnosis not present

## 2023-12-15 ENCOUNTER — Other Ambulatory Visit: Payer: Self-pay | Admitting: Family Medicine

## 2024-01-11 ENCOUNTER — Other Ambulatory Visit: Payer: Self-pay | Admitting: Family Medicine

## 2024-02-04 ENCOUNTER — Ambulatory Visit: Payer: Self-pay

## 2024-02-04 NOTE — Telephone Encounter (Signed)
 FYI Only or Action Required?: FYI only for provider.  Patient was last seen in primary care on 07/30/2023 by Brandy Boyle LABOR, MD.  Called Nurse Triage reporting urinary issues.  Symptoms began several days ago.  Interventions attempted: Nothing.  Symptoms are: gradually worsening.  Triage Disposition: See Physician Within 24 Hours  Patient/caregiver understands and will follow disposition?: Yes     Copied from CRM #[id number]. Topic: Clinical - Red Word Triage >> Feb 04, 2024  3:10 PM Mesmerise C wrote: [facility] that prompted transfer to Nurse Triage: Patient stated she's feeling a bladder infection, feeling worse when she needs to urinate not getting better feels pressure when needing to urinate Reason for Disposition  Age > 50 years  Answer Assessment - Initial Assessment Questions 1. SEVERITY: How bad is the pain?  (e.g., Scale 1-10; mild, moderate, or severe)     States no pain, pressure and urgency with urination 2. FREQUENCY: How many times have you had painful urination today?      Every time 3. PATTERN: Is pain present every time you urinate or just sometimes?      yes 4. ONSET: When did the painful urination start?      Three days and is taking AZO 5. FEVER: Do you have a fever? If Yes, ask: What is your temperature, how was it measured, and when did it start?     denies 6. PAST UTI: Have you had a urine infection before? If Yes, ask: When was the last time? and What happened that time?      yes 7. CAUSE: What do you think is causing the painful urination?  (e.g., UTI, scratch, Herpes sore)     UTI 8. OTHER SYMPTOMS: Do you have any other symptoms? (e.g., blood in urine, flank pain, genital sores, urgency, vaginal discharge)     denies 9. PREGNANCY: Is there any chance you are pregnant? When was your last menstrual period?     na  Protocols used: Urination Pain - Female-A-AH

## 2024-02-04 NOTE — Telephone Encounter (Signed)
 Noted

## 2024-02-05 ENCOUNTER — Other Ambulatory Visit (HOSPITAL_COMMUNITY)
Admission: RE | Admit: 2024-02-05 | Discharge: 2024-02-05 | Disposition: A | Discharge status: Home / Self Care | Source: Ambulatory Visit | Attending: Family Medicine | Admitting: Family Medicine

## 2024-02-05 ENCOUNTER — Encounter: Payer: Self-pay | Admitting: Family Medicine

## 2024-02-05 ENCOUNTER — Ambulatory Visit: Admitting: Family Medicine

## 2024-02-05 VITALS — BP 134/76 | HR 70 | Temp 97.9°F | Resp 18 | Ht 59.5 in | Wt 135.2 lb

## 2024-02-05 DIAGNOSIS — Z113 Encounter for screening for infections with a predominantly sexual mode of transmission: Secondary | ICD-10-CM | POA: Insufficient documentation

## 2024-02-05 DIAGNOSIS — R300 Dysuria: Secondary | ICD-10-CM

## 2024-02-05 DIAGNOSIS — N390 Urinary tract infection, site not specified: Secondary | ICD-10-CM

## 2024-02-05 LAB — POCT URINALYSIS DIPSTICK
Bilirubin, UA: NEGATIVE
Blood, UA: NEGATIVE
Glucose, UA: NEGATIVE
Ketones, UA: NEGATIVE
Nitrite, UA: NEGATIVE
Protein, UA: NEGATIVE
Spec Grav, UA: 1.01 (ref 1.010–1.025)
Urobilinogen, UA: NEGATIVE U/dL — AB
pH, UA: 6 (ref 5.0–8.0)

## 2024-02-05 MED ORDER — SULFAMETHOXAZOLE-TRIMETHOPRIM 800-160 MG PO TABS: 1.0000 | ORAL_TABLET | Freq: Two times a day (BID) | ORAL | 0 refills | Status: AC

## 2024-02-05 NOTE — Progress Notes (Signed)
 ACUTE VISIT Chief Complaint  Patient presents with   Urinary Frequency   Dysuria    Symptoms started yesterday.   HPI: [name] is a 73 y.o. female with past medical history significant for GERD, hypothyroidism, hyperlipidemia, depression/anxiety, and chronic nausea, who is here today complaining of urinary symptoms that started 2 to 3 days ago.  Dysuria  This is a new problem. The current episode started in the past 7 days. The problem occurs every urination. The problem has been gradually worsening. The quality of the pain is described as burning. The pain is moderate. There has been no fever. She is Sexually active. There is No history of pyelonephritis. Associated symptoms include frequency, hesitancy and urgency. Pertinent negatives include no chills, discharge, flank pain, hematuria, nausea, sweats or vomiting. The treatment provided mild relief.   Suprapubic pressure sensation when she feels the urge to urinate, resolved after urination. She has a history of UTI when she was sexually active about 20 years ago, started a new relationship and a week ago she started sexual intercourse.  She would like STD check today. She has not noted any vaginal bleeding or discharge.  Review of Systems  Constitutional:  Negative for chills.  HENT:  Negative for mouth sores and sore throat.   Respiratory:  Negative for cough.   Cardiovascular:  Negative for leg swelling.  Gastrointestinal:  Negative for abdominal pain, nausea and vomiting.  Genitourinary:  Positive for dysuria, frequency, hesitancy and urgency. Negative for flank pain and hematuria.  Musculoskeletal:  Negative for back pain and myalgias.  Skin:  Negative for rash.  Neurological:  Negative for syncope and weakness.  Psychiatric/Behavioral:  Negative for confusion and hallucinations.   See other pertinent positives and negatives in HPI.  Current Outpatient Medications on File Prior to Visit  Medication Sig Dispense  Refill   acetaminophen  (TYLENOL ) 500 MG tablet Take 1,000 mg by mouth every 6 (six) hours as needed for mild pain, moderate pain or headache.     clonazePAM  (KLONOPIN ) 1 MG tablet TAKE 1 TABLET BY MOUTH THREE TIMES DAILY AS NEEDED FOR ANXIETY 270 tablet 1   DULoxetine  (CYMBALTA ) 30 MG capsule Take 1 capsule by mouth once daily 90 capsule 3   estradiol (ESTRACE) 0.1 MG/GM vaginal cream Place vaginally.     Multiple Vitamins-Minerals (MULTIVITAMIN WITH MINERALS) tablet Take 1 tablet by mouth daily.     Red Yeast Rice Extract (RED YEAST RICE PO) Take by mouth.     Vitamin D-Vitamin K (VITAMIN K2-VITAMIN D3 PO) Take by mouth.     Calcium  Carbonate-Vitamin D (CALCIUM  + D PO) Take by mouth daily. (Patient not taking: Reported on 02/05/2024)     levothyroxine  (SYNTHROID ) 50 MCG tablet Take 1 tablet (50 mcg total) by mouth daily. (Patient not taking: Reported on 02/05/2024) 90 tablet 3   [DISCONTINUED] omeprazole  (PRILOSEC) 40 MG capsule Take 1 capsule (40 mg total) by mouth daily. (Patient not taking: Reported on 08/26/2014) 30 capsule 11   No current facility-administered medications on file prior to visit.   Past Medical History:  Diagnosis Date   Anxiety    Arthritis    Bloating    Cyclic vomiting syndrome - suspected 10/13/2014   Depression    GERD (gastroesophageal reflux disease)    Helicobacter pylori gastritis 09/09/2014   History of epigastric pain    Hyperlipidemia    Hypothyroidism    Migraine headache with aura    Nausea & vomiting    chronic  Osteopenia    last DEXA 01-28-08   Vitreous tug syndrome    sees Dr. Selinda Slocumb    Allergies  Allergen Reactions   Statins Other (See Comments)    Myalgias   Social History   Socioeconomic History   Marital status: Single    Spouse name: Not on file   Number of children: Not on file   Years of education: Not on file   Highest education level: Not on file  Occupational History   Not on file  Tobacco Use   Smoking status: Never    Smokeless tobacco: Never  Substance and Sexual Activity   Alcohol use: No    Alcohol/week: 0.0 standard drinks of alcohol   Drug use: No   Sexual activity: Not on file  Other Topics Concern   Not on file  Social History Narrative   Not on file   Social Drivers of Health   Financial Resource Strain: Low Risk  (12/29/2020)   Overall Financial Resource Strain (CARDIA)    Difficulty of Paying Living Expenses: Not hard at all  Food Insecurity: No Food Insecurity (12/29/2020)   Hunger Vital Sign    Worried About Running Out of Food in the Last Year: Never true    Ran Out of Food in the Last Year: Never true  Transportation Needs: No Transportation Needs (12/29/2020)   PRAPARE - Administrator, Civil Service (Medical): No    Lack of Transportation (Non-Medical): No  Physical Activity: Sufficiently Active (12/29/2020)   Exercise Vital Sign    Days of Exercise per Week: 5 days    Minutes of Exercise per Session: 60 min  Stress: No Stress Concern Present (12/29/2020)   [employer] of Occupational Health - Occupational Stress Questionnaire    Feeling of Stress : Not at all  Social Connections: Moderately Isolated (12/29/2020)   Social Connection and Isolation Panel    Frequency of Communication with Friends and Family: Three times a week    Frequency of Social Gatherings with Friends and Family: Three times a week    Attends Religious Services: Never    Active Member of Clubs or Organizations: Yes    Attends Banker Meetings: More than 4 times per year    Marital Status: Divorced   Vitals:   02/05/24 0752  BP: 134/76  Pulse: 70  Resp: 18  Temp: 97.9 F (36.6 C)  SpO2: 99%   Body mass index is 26.85 kg/m.  Physical Exam Vitals and nursing note reviewed.  Constitutional:      General: She is not in acute distress.    Appearance: She is well-developed.  HENT:     Head: Normocephalic and atraumatic.  Eyes:     Conjunctiva/sclera: Conjunctivae normal.   Cardiovascular:     Rate and Rhythm: Normal rate and regular rhythm.     Heart sounds: No murmur heard. Pulmonary:     Effort: Pulmonary effort is normal. No respiratory distress.     Breath sounds: Normal breath sounds.  Abdominal:     Palpations: Abdomen is soft. There is no mass.     Tenderness: There is no abdominal tenderness. There is no right CVA tenderness or left CVA tenderness.  Skin:    General: Skin is warm.     Findings: No erythema.  Neurological:     General: No focal deficit present.     Mental Status: She is alert and oriented to person, place, and time.  Gait: Gait normal.  Psychiatric:        Mood and Affect: Mood normal.   ASSESSMENT AND PLAN:  Ms. Hinkson was evaluated today for urinary symptoms.  Dysuria Urine dipstick mildly abnormal, 2+ leukocytes. We will send urine for culture.  -     POCT urinalysis dipstick -     Urine Culture; Future  Screen for STD (sexually transmitted disease) Asymptomatic. Chlamydia, gonorrhea, and trichomonas screening added to urine sample as requested.  She is not interested in HIV or syphilis screening today.  -     Urine cytology ancillary only  Urinary tract infection without hematuria, site unspecified Empiric treatment with Bactrim  DS twice daily for 5 days started today. Continue adequate hydration. We will tailor treatment according to urine culture results. Instructed about warning signs.  -     Sulfamethoxazole -Trimethoprim ; Take 1 tablet by mouth 2 (two) times daily for 5 days.  Dispense: 10 tablet; Refill: 0   Return if symptoms worsen or fail to improve.  Analea Muller G. [place], MD  [facility]. [place] office.

## 2024-02-05 NOTE — Patient Instructions (Addendum)
 A few things to remember from today's visit:  Dysuria - Plan: POC Urinalysis Dipstick, Culture, Urine  Screen for STD (sexually transmitted disease) - Plan: Urine cytology ancillary only  Urinary tract infection without hematuria, site unspecified - Plan: sulfamethoxazole -trimethoprim  (BACTRIM  DS) 800-160 MG tablet  Adequate fluid intake, avoid holding urine for long hours, and over the counter Vit C OR cranberry capsules might help.  Today we will treat empirically with antibiotic, which we might need to change when urine culture comes back depending of bacteria susceptibility.  Seek immediate medical attention if severe abdominal pain, vomiting, fever/chills, or worsening symptoms. F/U if symptomatic are not any better after 2-3 days of antibiotic treatment.  Do not use My Chart to request refills or for acute issues that need immediate attention. If you send a my chart message, it may take a few days to be addressed, specially if I am not in the office.  Please be sure medication list is accurate. If a new problem present, please set up appointment sooner than planned today.

## 2024-02-06 ENCOUNTER — Telehealth: Payer: Self-pay | Admitting: *Deleted

## 2024-02-06 LAB — URINE CYTOLOGY ANCILLARY ONLY
Chlamydia: NEGATIVE
Comment: NEGATIVE
Comment: NEGATIVE
Comment: NORMAL
Neisseria Gonorrhea: NEGATIVE
Trichomonas: NEGATIVE

## 2024-02-06 LAB — URINE CULTURE
MICRO NUMBER:: 16949064
Result:: NO GROWTH
SPECIMEN QUALITY:: ADEQUATE

## 2024-02-06 NOTE — Telephone Encounter (Signed)
 Copied from CRM #[id number]. Topic: Clinical - Lab/Test Results >> Feb 06, 2024  2:57 PM Franky GRADE wrote: Reason for CRM: Patient is calling for the results of the urinalysis she had done yesterday due to UTI symptoms.

## 2024-02-07 ENCOUNTER — Ambulatory Visit: Payer: Self-pay | Admitting: Family Medicine

## 2024-02-07 NOTE — Telephone Encounter (Signed)
 Patient called to follow up on UTI results

## 2024-02-07 NOTE — Telephone Encounter (Signed)
 Message sent to patient through MyChart, urine culture was negative. Brandy Hibler [place], MD

## 2024-02-07 NOTE — Telephone Encounter (Signed)
 Pt Urine culture results have not been resulted, pt will be notified

## 2024-02-07 NOTE — Telephone Encounter (Signed)
 Dr. [place] ordered this

## 2024-02-10 ENCOUNTER — Telehealth: Payer: Self-pay

## 2024-02-10 NOTE — Telephone Encounter (Signed)
 Spoke to pt and inform her of provider's lab result.   Pt verbalized understanding and stated that she already finish the Rx and she is feeling better.

## 2024-02-10 NOTE — Telephone Encounter (Signed)
 Noted

## 2024-02-10 NOTE — Telephone Encounter (Signed)
 Copied from CRM [id number]. Topic: Clinical - Medical Advice >> Feb 10, 2024  1:34 PM Carlatta H wrote: Reason for CRM: The patient would like a call back from Dr Betty [place] about lab results

## 2024-05-07 ENCOUNTER — Ambulatory Visit: Payer: Self-pay

## 2024-05-07 NOTE — Telephone Encounter (Signed)
 FYI Only or Action Required?: FYI only for provider: appointment scheduled on 05/08/24.  Patient was last seen in primary care on 02/05/2024 by Jordan, Betty G, MD.  Called Nurse Triage reporting Hip Pain.  Symptoms began several weeks ago.  Interventions attempted: Nothing.  Symptoms are: unchanged.  Triage Disposition: See PCP When Office is Open (Within 3 Days)  Patient/caregiver understands and will follow disposition?: Yes   Copied from CRM #[id number]. Topic: Clinical - Red Word Triage >> May 07, 2024  3:12 PM Thersia BROCKS wrote: [facility] that prompted transfer to Nurse Triage: Patient called in regarding having some stiffness/pain when getting up to walk after she has been sitting for a while, wanted to know if she needs to see pcp or orthopedic doctor Reason for Disposition  [1] MODERATE pain (e.g., interferes with normal activities, limping) AND [2] present > 3 days  Answer Assessment - Initial Assessment Questions Scheduled 05/08/24  Advised call back or ED/911 if symptoms worsen. Pt verbalized understanding.  1. LOCATION and RADIATION: Where is the pain located? Does the pain spread (shoot) anywhere else?     Both leg, upper thighs, anterior, does not radiate 2. QUALITY: What does the pain feel like?  (e.g., sharp, dull, aching, burning)     sharp 3. SEVERITY: How bad is the pain? What does it keep you from doing?   (Scale 1-10; or mild, moderate, severe)     7/10 4. ONSET: When did the pain start? Does it come and go, or is it there all the time?   October 25 5. WORK OR EXERCISE: Has there been any recent work or exercise that involved this part of the body?      Works out 6. CAUSE: What do you think is causing the hip pain?      unsure 7. AGGRAVATING FACTORS: What makes the hip pain worse? (e.g., walking, climbing stairs, running)     walking 8. OTHER SYMPTOMS: Do you have any other symptoms? (e.g., back pain, pain shooting down leg,  fever,  rash)     denies  Protocols used: Hip Pain-A-AH

## 2024-05-08 ENCOUNTER — Ambulatory Visit (INDEPENDENT_AMBULATORY_CARE_PROVIDER_SITE_OTHER): Admitting: Family Medicine

## 2024-05-08 ENCOUNTER — Ambulatory Visit

## 2024-05-08 ENCOUNTER — Encounter: Payer: Self-pay | Admitting: Family Medicine

## 2024-05-08 VITALS — BP 120/82 | HR 73 | Temp 98.3°F | Ht 59.5 in | Wt 135.8 lb

## 2024-05-08 DIAGNOSIS — M25552 Pain in left hip: Secondary | ICD-10-CM

## 2024-05-08 DIAGNOSIS — M25551 Pain in right hip: Secondary | ICD-10-CM | POA: Diagnosis not present

## 2024-05-08 DIAGNOSIS — M47816 Spondylosis without myelopathy or radiculopathy, lumbar region: Secondary | ICD-10-CM | POA: Diagnosis not present

## 2024-05-08 NOTE — Progress Notes (Signed)
° °  Subjective:    Patient ID: Brandy Boyle, female    DOB: 02-10-1951, 73 y.o.   MRN: [id number]  HPI Here for stiffness and pain in both hips. She has had this for several years, but the paiin has worsened the past few months. She applies ice and takes Tylenol  as needed. No back pain.    Review of Systems  Constitutional: Negative.   Respiratory: Negative.    Cardiovascular: Negative.   Musculoskeletal:  Positive for arthralgias.       Objective:   Physical Exam Constitutional:      Appearance: Normal appearance.     Comments: She walks easily   Cardiovascular:     Rate and Rhythm: Normal rate and regular rhythm.     Pulses: Normal pulses.     Heart sounds: Normal heart sounds.  Pulmonary:     Effort: Pulmonary effort is normal.     Breath sounds: Normal breath sounds.  Musculoskeletal:     Comments: Both hips have full ROM with mild discomfort   Neurological:     Mental Status: She is alert.           Assessment & Plan:  Bilateral hip pain, likely from OA. We will get baseline Xrays today. Refer to PT. Garnette Olmsted, MD

## 2024-05-14 ENCOUNTER — Ambulatory Visit: Admitting: Physical Therapy

## 2024-05-14 ENCOUNTER — Ambulatory Visit: Attending: Family Medicine | Admitting: Physical Therapy

## 2024-05-14 ENCOUNTER — Other Ambulatory Visit: Payer: Self-pay

## 2024-05-14 ENCOUNTER — Encounter: Payer: Self-pay | Admitting: Physical Therapy

## 2024-05-14 DIAGNOSIS — R2689 Other abnormalities of gait and mobility: Secondary | ICD-10-CM | POA: Insufficient documentation

## 2024-05-14 DIAGNOSIS — M25552 Pain in left hip: Secondary | ICD-10-CM | POA: Insufficient documentation

## 2024-05-14 DIAGNOSIS — M6281 Muscle weakness (generalized): Secondary | ICD-10-CM | POA: Insufficient documentation

## 2024-05-14 DIAGNOSIS — M25551 Pain in right hip: Secondary | ICD-10-CM | POA: Insufficient documentation

## 2024-05-14 NOTE — Therapy (Signed)
 OUTPATIENT PHYSICAL THERAPY LOWER EXTREMITY EVALUATION   Patient Name: Brandy Boyle MRN: [id number] DOB:1950/07/03, 73 y.o., female Today's Date: 05/14/2024  END OF SESSION:  PT End of Session - 05/14/24 1610     Visit Number 1    Date for Recertification  08/12/24    Authorization Type [employer]  AUTH REQ    PT Start Time 1557    PT Stop Time 1640    PT Time Calculation (min) 43 min    Activity Tolerance Patient tolerated treatment well    Behavior During Therapy WFL for tasks assessed/performed          Past Medical History:  Diagnosis Date   Anxiety    Arthritis    Bloating    Cyclic vomiting syndrome - suspected 10/13/2014   Depression    GERD (gastroesophageal reflux disease)    Helicobacter pylori gastritis 09/09/2014   History of epigastric pain    Hyperlipidemia    Hypothyroidism    Migraine headache with aura    Nausea & vomiting    chronic   Osteopenia    last DEXA 01-28-08   Vitreous tug syndrome    sees Dr. Selinda Slocumb    Past Surgical History:  Procedure Laterality Date   COLONOSCOPY  03/18/2014   per Dr. Avram, diverticulosis only, repeat in 10 years   ESOPHAGOGASTRODUODENOSCOPY  09/06/2014   per Dr. Avram, had H pylori gastr   TONSILLECTOMY  05/28/1956   Patient Active Problem List   Diagnosis Date Noted   Osteopenia 07/27/2022   COVID-19 virus infection 10/28/2020   Depression with anxiety 01/08/2018   Hyperlipidemia 12/13/2015   Cyclic vomiting syndrome - suspected 10/13/2014   Abdominal migraine - suspected 10/13/2014   Helicobacter pylori gastritis 09/09/2014   ECZEMA 06/20/2009   Hypothyroidism 01/27/2008   GERD 06/30/2007   Chronic nausea 03/10/2007    PCP: Johnny Garnette LABOR, MD  REFERRING PROVIDER: Johnny Garnette LABOR, MD REFERRING DIAG: [phone] (ICD-10-CM) - Bilateral hip pain   THERAPY DIAG:  Muscle weakness (generalized)  Other abnormalities of gait and mobility  Rationale for Evaluation and Treatment:  Rehabilitation  ONSET DATE: 2-3 months ago ( fall - when it got colder)  SUBJECTIVE:   SUBJECTIVE STATEMENT: Patient reports that she has had issues with her hips before, her mom had her hips replaced, had stroke and died. They have arthritic hip in the family. She uses ice to put on her hips.  Patient had x rays last week. 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 No only at night when hips are stiff, anterior hip pain 4-5/10  PRECAUTIONS: None  RED FLAGS: None   WEIGHT BEARING RESTRICTIONS: No  FALLS:  Has patient fallen in last 6 months? 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  DIAGNOSTIC FINDINGS: she had x rays, but they read yet  PATIENT SURVEYS:  LEFS  Extreme difficulty/unable (0), Quite a bit of difficulty (1), Moderate difficulty (2), Little difficulty (3), No difficulty (  4) Survey date:    Any of your usual work, housework or school activities 4  2. Usual hobbies, recreational or sporting activities 3  3. Getting into/out of the bath 3  4. Walking between rooms 4  5. Putting on socks/shoes 3  6. Squatting  3  7. Lifting an object, like a bag of groceries from the floor 3  8. Performing light activities around your home 3  9. Performing heavy activities around your home 3  10. Getting into/out of a car 3  11. Walking 2 blocks 3  12. Walking 1 mile 2  13. Going up/down 10 stairs (1 flight) 3  14. Standing for 1 hour 2  15.  sitting for 1 hour 3  16.  Running on even ground 2  17. Running on uneven ground 2  18. Making sharp turns while running fast 1  19. Hopping  2  20. Rolling over in bed 4  Score total:  54     COGNITION: Overall cognitive status: Within functional limits for tasks assessed     SENSATION: WFL  EDEMA:    MUSCLE LENGTH: Hamstrings: Right 100 deg; Left 100 deg  POSTURE: rounded shoulders and forward head  PALPATION: Bilateral hamstring tightness  LOWER EXTREMITY ROM: full for hips, bilateral knee -10 D extension   LOWER EXTREMITY MMT:  4-/ 5 bilateral knees  MMT Right eval Left eval  Hip flexion 4+/5 4+/5  Hip extension    Hip abduction    Hip adduction    Hip internal rotation    Hip external rotation    Knee flexion 4 4  Knee extension 4 4  Ankle dorsiflexion    Ankle plantarflexion    Ankle inversion    Ankle eversion     (Blank rows = not tested)  LOWER EXTREMITY SPECIAL TESTS:  Hip special tests: Hip scouring test: negative  FUNCTIONAL TESTS:  5 times sit to stand: 6,3s  GAIT: Distance walked: 60 ft Assistive device utilized: None Level of assistance: Complete Independence Comments: WFL                                                                                                                                TREATMENT DATE: 05/14/2024    PATIENT EDUCATION/ there acts Education details:  Examination completed, findings reviewed, pt educated on POC, HEP. Pt motivated to participate in PT and agreeable to attempt recommendations.   Person educated: Patient Education method: Explanation, Demonstration, Tactile cues, Verbal cues, and Handouts Education comprehension: verbalized understanding, returned demonstration, verbal cues required, tactile cues required, and needs further education  HOME EXERCISE PROGRAM: Access Code: [email or web address] URL: [email or web address] Date: 05/14/2024 Prepared by: Cori Victory Strollo  Exercises - Seated Hamstring Stretch  - 1 x daily -  7 x weekly - 2 sets - 10 reps  ASSESSMENT:  CLINICAL IMPRESSION: Patient is a 73 y.o. F who was seen today for physical therapy evaluation and treatment for  bilateral hip pain. Patient has had increased hip pain at night, pain is up to 5/10 and wakes her up. She uses ice to soothe pain and is able to go back to sleep. During the day she does not have hip pain when she is moving, walking, exercising. She reported 2 falls on her knees about a year ago when she fell, hit her knees and face. Exam findings notable for bilateral hamstring tightness, some bilateral knee weakness, good bilateral hip PROM and AROM. Patient reported that she got new shoe inserts recently and they have also helped as well as not wearing heals. Patient will benefit from PT to reduce pain and improve quality of life. At the end of visit, patient reported that she had no more questions, her concerns were addressed.  OBJECTIVE IMPAIRMENTS: decreased ROM, decreased strength, impaired tone, and pain.   ACTIVITY LIMITATIONS: locomotion level  PARTICIPATION LIMITATIONS: community activity and occupation  PERSONAL FACTORS: Fitness are also affecting patient's functional outcome.   REHAB POTENTIAL: Good  CLINICAL DECISION MAKING: Evolving/moderate complexity  EVALUATION COMPLEXITY: Moderate   GOALS: Goals reviewed with patient? Yes  SHORT TERM GOALS: Target date: 06/11/2024   Patient will report max 3/10 pain bilateral hips at night Baseline:5/10 Goal status: INITIAL  2.  Patient will be able to amb at least 45 mins without increased hip pain Baseline:  Goal status: INITIAL  3.  Pt will be independent with HEP.   Baseline:  Goal status: INITIAL    LONG TERM GOALS: Target date: 08/06/2024    Pt will be independent with advanced HEP.   Baseline:  Goal status: INITIAL  2.  Patient will report 0/10 bilateral hip pain Baseline:  Goal status: INITIAL  3.  Patient will not be woken up by hip pain- in order to  get more restful sleep Baseline:  Goal status: INITIAL  4.  Patient will be able to walk as much as needed without increased hip pain Baseline:  Goal status: INITIAL  5.  Patient will have improved bilat knee strength to 4+/5 Baseline:  Goal status: INITIAL    PLAN:  PT FREQUENCY: 1-2x/week  PT DURATION: 12 weeks  PLANNED INTERVENTIONS: 97110-Therapeutic exercises, 97530- Therapeutic activity, 97112- Neuromuscular re-education, 97535- Self Care, [id number]- Manual therapy, [phone]- Electrical stimulation (manual), [phone] (1-2 muscles), [phone] (3+ muscles)- Dry Needling, Patient/Family education, Balance training, Stair training, Taping, Joint mobilization, Joint manipulation, Spinal manipulation, Spinal mobilization, Manual lymph drainage, Scar mobilization, Cryotherapy, Moist heat, and Biofeedback  PLAN FOR NEXT SESSION: bilateral hamstring stretches, knee strengthening, nu step   Janiel Derhammer, PT 05/14/2024, 5:13 PM

## 2024-06-01 NOTE — Therapy (Signed)
 " OUTPATIENT PHYSICAL THERAPY LOWER EXTREMITY TREATMENT   Patient Name: Brandy Boyle MRN: [id number] DOB:1950-08-24, 74 y.o., female Today's Date: 06/02/2024  END OF SESSION:  PT End of Session - 06/02/24 1249     Visit Number 2    Date for Recertification  08/12/24    Authorization Type [employer] 2026  No auth required    PT Start Time 1248    PT Stop Time 1330    PT Time Calculation (min) 42 min    Activity Tolerance Patient tolerated treatment well    Behavior During Therapy [facility] for tasks assessed/performed           Past Medical History:  Diagnosis Date   Anxiety    Arthritis    Bloating    Cyclic vomiting syndrome - suspected 10/13/2014   Depression    GERD (gastroesophageal reflux disease)    Helicobacter pylori gastritis 09/09/2014   History of epigastric pain    Hyperlipidemia    Hypothyroidism    Migraine headache with aura    Nausea & vomiting    chronic   Osteopenia    last DEXA 01-28-08   Vitreous tug syndrome    sees Dr. Selinda Slocumb    Past Surgical History:  Procedure Laterality Date   COLONOSCOPY  03/18/2014   per Dr. Avram, diverticulosis only, repeat in 10 years   ESOPHAGOGASTRODUODENOSCOPY  09/06/2014   per Dr. Avram, had H pylori gastr   TONSILLECTOMY  05/28/1956   Patient Active Problem List   Diagnosis Date Noted   Osteopenia 07/27/2022   COVID-19 virus infection 10/28/2020   Depression with anxiety 01/08/2018   Hyperlipidemia 12/13/2015   Cyclic vomiting syndrome - suspected 10/13/2014   Abdominal migraine - suspected 10/13/2014   Helicobacter pylori gastritis 09/09/2014   ECZEMA 06/20/2009   Hypothyroidism 01/27/2008   GERD 06/30/2007   Chronic nausea 03/10/2007    PCP: Johnny Garnette LABOR, MD  REFERRING PROVIDER: Johnny Garnette LABOR, MD REFERRING DIAG: [phone] (ICD-10-CM) - Bilateral hip pain   THERAPY DIAG:  Muscle weakness (generalized)  Other abnormalities of gait and mobility  Rationale for Evaluation and  Treatment: Rehabilitation  ONSET DATE: 2-3 months ago ( fall - when it got colder)  SUBJECTIVE:   SUBJECTIVE STATEMENT: Patient reports that stretches are working well for her, her hip is feeling much better. No longer being woken up by hip pain.  She got some new weights as well. #8. Has some anterior hip soreness when she sits too long Wants to work on balance to prevent knee injuries.     eval Patient reports that she has had issues with her hips before, her mom had her hips replaced, had stroke and died. They have arthritic hip in the family. She uses ice to put on her hips.  Patient had x rays last week. 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 No only at night when hips are stiff, anterior hip pain 4-5/10  PRECAUTIONS: None  RED FLAGS: None   WEIGHT BEARING RESTRICTIONS: No  FALLS:  Has patient fallen in last 6 months? 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  DIAGNOSTIC FINDINGS: she had x rays, but they read yet  PATIENT SURVEYS:  LEFS  Extreme difficulty/unable (0), Quite a bit of difficulty (1), Moderate difficulty (2), Little difficulty (3), No difficulty (4) Survey date:    Any of your usual work, housework or school activities 4  2. Usual hobbies, recreational or sporting activities 3  3. Getting into/out of the bath 3  4. Walking between rooms 4  5. Putting on socks/shoes 3  6. Squatting  3  7. Lifting an object, like a bag of groceries from  the floor 3  8. Performing light activities around your home 3  9. Performing heavy activities around your home 3  10. Getting into/out of a car 3  11. Walking 2 blocks 3  12. Walking 1 mile 2  13. Going up/down 10 stairs (1 flight) 3  14. Standing for 1 hour 2  15.  sitting for 1 hour 3  16. Running on even ground 2  17. Running on uneven ground 2  18. Making sharp turns while running fast 1  19. Hopping  2  20. Rolling over in bed 4  Score total:  54     COGNITION: Overall cognitive status: Within functional limits for tasks assessed     SENSATION: WFL  EDEMA:    MUSCLE LENGTH: Hamstrings: Right 100 deg; Left 100 deg  POSTURE: rounded shoulders and forward head  PALPATION: Bilateral hamstring tightness  LOWER EXTREMITY ROM: full for hips, bilateral knee -10 D extension   LOWER EXTREMITY MMT:  4-/ 5 bilateral knees  MMT Right eval Left eval  Hip flexion 4+/5 4+/5  Hip extension    Hip abduction    Hip adduction    Hip internal rotation    Hip external rotation    Knee flexion 4 4  Knee extension 4 4  Ankle dorsiflexion    Ankle plantarflexion    Ankle inversion    Ankle eversion     (Blank rows = not tested)  LOWER EXTREMITY SPECIAL TESTS:  Hip special tests: Hip scouring test: negative  FUNCTIONAL TESTS:  5 times sit to stand: 6,3s  GAIT: Distance walked: 60 ft Assistive device utilized: None Level of assistance: Complete Independence Comments: WFL                                                                                                                                TREATMENT DATE: 06/03/2023 Half kneel lunge on yoga mat stretch 3x30s Pigeon stretch 3 resp Thomas stretch off table 3 reps Butterfly stretch 45 s Figure four stretch with rotation 10 reps Hip abduction 1 plate 20 reps bilat Leg press 2 plates 30 reps Nu Step for 7 minutes, level 7, bilateral upper and lower extremities, therapist present  05/14/2024     PATIENT EDUCATION/ there acts Education details:  Examination completed, findings reviewed, pt educated on POC, HEP. Pt motivated to participate in PT and agreeable to attempt recommendations.   Person educated: Patient Education method: Explanation, Demonstration, Tactile cues, Verbal cues, and Handouts Education comprehension: verbalized understanding, returned demonstration, verbal cues required, tactile cues required, and needs further education  HOME EXERCISE PROGRAM: Access Code: [email or web address] URL: [email or web address] Date: 05/14/2024 Prepared by: Cori Cortney Mckinney  Exercises - Seated Hamstring Stretch  - 1 x daily - 7 x weekly - 2 sets - 10 reps  ASSESSMENT:  CLINICAL IMPRESSION: Patient did well with her stretches and new exercises today. Minor hip pain recently after prolonged sitting. Has a history of falls on her knees and had some knee pain with kneeling on the floor and mild right adductor tenderness with palpation. Has good bilateral hip range, mild restrictions in hip flexors likely due to posture. Progressing well towards goals.     eval Patient is a 74 y.o. F who was seen today for physical therapy evaluation and treatment for bilateral hip pain. Patient has had increased hip pain at night, pain is up to 5/10 and wakes her up. She uses ice to soothe pain and is able to go back to sleep. During the day she does not have hip pain when she is moving, walking, exercising. She reported 2 falls on her knees about a year ago when she fell, hit her knees and face. Exam findings notable for bilateral hamstring tightness, some bilateral knee weakness, good bilateral hip PROM and AROM. Patient reported that she got new shoe inserts recently and they have also helped as well as not wearing heals. Patient will benefit from PT to reduce pain and improve quality of life. At the end of visit, patient reported that she had no more questions, her concerns were addressed.   OBJECTIVE IMPAIRMENTS: decreased ROM, decreased strength, impaired tone, and pain.   ACTIVITY LIMITATIONS: locomotion level  PARTICIPATION LIMITATIONS: community activity and occupation  PERSONAL FACTORS: Fitness are also affecting patient's functional outcome.   REHAB POTENTIAL: Good  CLINICAL DECISION MAKING: Evolving/moderate complexity  EVALUATION COMPLEXITY: Moderate   GOALS: Goals reviewed with patient? Yes  SHORT TERM GOALS: Target date: 06/11/2024   Patient will report max 3/10 pain bilateral hips at night Baseline:5/10 Goal status: INITIAL  2.  Patient will be able to amb at least 45 mins without increased hip pain Baseline:  Goal status: INITIAL  3.  Pt will be independent with HEP.   Baseline:  Goal status: INITIAL    LONG TERM GOALS: Target date: 08/06/2024    Pt will be independent with advanced HEP.   Baseline:  Goal status: INITIAL  2.  Patient will report 0/10 bilateral hip pain Baseline:  Goal status: INITIAL  3.  Patient will not be woken up by hip pain- in order to get more restful sleep Baseline:  Goal status: INITIAL  4.  Patient will be able to walk as much as needed without increased hip pain Baseline:  Goal status: INITIAL  5.  Patient will have improved bilat knee strength to 4+/5 Baseline:  Goal status: INITIAL    PLAN:  PT FREQUENCY: 1-2x/week  PT DURATION: 12 weeks  PLANNED INTERVENTIONS: 97110-Therapeutic exercises, 97530- Therapeutic activity, 97112- Neuromuscular re-education, 97535- Self Care, [id number]- Manual therapy, [phone]- Electrical stimulation (manual), [phone] (1-2 muscles), [phone] (3+ muscles)- Dry Needling, Patient/Family education, Balance training, Stair training, Taping, Joint mobilization, Joint manipulation, Spinal manipulation, Spinal  mobilization, Manual lymph drainage, Scar mobilization, Cryotherapy, Moist heat, and Biofeedback  PLAN FOR NEXT SESSION: bilateral hamstring stretches, knee strengthening, nu  step, balance for fall prevention, step over , obstacle course   Casson Catena, PT 06/02/2024, 1:23 PM  "

## 2024-06-02 ENCOUNTER — Encounter: Payer: Self-pay | Admitting: Physical Therapy

## 2024-06-02 ENCOUNTER — Ambulatory Visit: Attending: Family Medicine | Admitting: Physical Therapy

## 2024-06-02 DIAGNOSIS — R2689 Other abnormalities of gait and mobility: Secondary | ICD-10-CM | POA: Diagnosis present

## 2024-06-02 DIAGNOSIS — M6281 Muscle weakness (generalized): Secondary | ICD-10-CM | POA: Insufficient documentation

## 2024-06-17 NOTE — Therapy (Signed)
 " OUTPATIENT PHYSICAL THERAPY LOWER EXTREMITY TREATMENT   Patient Name: Brandy Boyle MRN: [id number] DOB:09/30/1950, 74 y.o., female Today's Date: 06/18/2024  END OF SESSION:  PT End of Session - 06/18/24 1402     Visit Number 3    Date for Recertification  08/12/24    Authorization Type [employer] Medicare 2026  No auth required    PT Start Time 1402    PT Stop Time 1440    PT Time Calculation (min) 38 min    Activity Tolerance Patient tolerated treatment well    Behavior During Therapy [facility] for tasks assessed/performed            Past Medical History:  Diagnosis Date   Anxiety    Arthritis    Bloating    Cyclic vomiting syndrome - suspected 10/13/2014   Depression    GERD (gastroesophageal reflux disease)    Helicobacter pylori gastritis 09/09/2014   History of epigastric pain    Hyperlipidemia    Hypothyroidism    Migraine headache with aura    Nausea & vomiting    chronic   Osteopenia    last DEXA 01-28-08   Vitreous tug syndrome    sees Dr. Selinda Slocumb    Past Surgical History:  Procedure Laterality Date   COLONOSCOPY  03/18/2014   per Dr. Avram, diverticulosis only, repeat in 10 years   ESOPHAGOGASTRODUODENOSCOPY  09/06/2014   per Dr. Avram, had H pylori gastr   TONSILLECTOMY  05/28/1956   Patient Active Problem List   Diagnosis Date Noted   Osteopenia 07/27/2022   COVID-19 virus infection 10/28/2020   Depression with anxiety 01/08/2018   Hyperlipidemia 12/13/2015   Cyclic vomiting syndrome - suspected 10/13/2014   Abdominal migraine - suspected 10/13/2014   Helicobacter pylori gastritis 09/09/2014   ECZEMA 06/20/2009   Hypothyroidism 01/27/2008   GERD 06/30/2007   Chronic nausea 03/10/2007    PCP: Johnny Garnette LABOR, MD  REFERRING PROVIDER: Johnny Garnette LABOR, MD REFERRING DIAG: [phone] (ICD-10-CM) - Bilateral hip pain   THERAPY DIAG:  Muscle weakness (generalized)  Other abnormalities of gait and mobility  Rationale for Evaluation  and Treatment: Rehabilitation  ONSET DATE: 2-3 months ago ( fall - when it got colder)  SUBJECTIVE:   SUBJECTIVE STATEMENT: Patient reports that she is a little achy today, her left hip and she swears it's the weather. It gets worse at night when she is asleep and it gets stiff She felt good after last visit She fell last week at a restaurant and hit her head, felt ok, had a cervical X ray, they saw some arthritis    Last visit Patient reports that stretches are working well for her, her hip is feeling much better. No longer being woken up by hip pain.  She got some new weights as well. #8. Has some anterior hip soreness when she sits too long Wants to work on balance to prevent knee injuries.     eval Patient reports that she has had issues with her hips before, her mom had her hips replaced, had stroke and died. They have arthritic hip in the family. She uses ice to put on her hips.  Patient had x rays last week. 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 No only at night when hips  are stiff, anterior hip pain 4-5/10  PRECAUTIONS: None  RED FLAGS: None   WEIGHT BEARING RESTRICTIONS: No  FALLS:  Has patient fallen in last 6 months? 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  DIAGNOSTIC FINDINGS: she had x rays, but they read yet  PATIENT SURVEYS:  LEFS  Extreme difficulty/unable (0), Quite a bit of difficulty (1), Moderate difficulty (2), Little difficulty (3),  No difficulty (4) Survey date:    Any of your usual work, housework or school activities 4  2. Usual hobbies, recreational or sporting activities 3  3. Getting into/out of the bath 3  4. Walking between rooms 4  5. Putting on socks/shoes 3  6. Squatting  3  7. Lifting an object, like a bag of groceries from the floor 3  8. Performing light activities around your home 3  9. Performing heavy activities around your home 3  10. Getting into/out of a car 3  11. Walking 2 blocks 3  12. Walking 1 mile 2  13. Going up/down 10 stairs (1 flight) 3  14. Standing for 1 hour 2  15.  sitting for 1 hour 3  16. Running on even ground 2  17. Running on uneven ground 2  18. Making sharp turns while running fast 1  19. Hopping  2  20. Rolling over in bed 4  Score total:  54     COGNITION: Overall cognitive status: Within functional limits for tasks assessed     SENSATION: WFL  EDEMA:    MUSCLE LENGTH: Hamstrings: Right 100 deg; Left 100 deg  POSTURE: rounded shoulders and forward head  PALPATION: Bilateral hamstring tightness  LOWER EXTREMITY ROM: full for hips, bilateral knee -10 D extension   LOWER EXTREMITY MMT:  4-/ 5 bilateral knees  MMT Right eval Left eval  Hip flexion 4+/5 4+/5  Hip extension    Hip abduction    Hip adduction    Hip internal rotation    Hip external rotation    Knee flexion 4 4  Knee extension 4 4  Ankle dorsiflexion    Ankle plantarflexion    Ankle inversion    Ankle eversion     (Blank rows = not tested)  LOWER EXTREMITY SPECIAL TESTS:  Hip special tests: Hip scouring test: negative  FUNCTIONAL TESTS:  5 times sit to stand: 6,3s  GAIT: Distance walked: 60 ft Assistive device utilized: None Level of assistance: Complete Independence Comments: [facility]  TREATMENT DATE: 06/18/2024 Review of progress Nu Step for 7  minutes, level 7, bilateral upper and lower extremities, therapist present   Balance act- #25  cable walkaway                      Cones step overs, forward and back, sideways and without looking down at them                      Foam- heel rocking                                 Tandem stance                                  Single leg stance on foam with head turns and looking up and down ([place] exercises)                          06/03/2023 Half kneel lunge on yoga mat stretch 3x30s Pigeon stretch 3 resp Thomas stretch off table 3 reps Butterfly stretch 45 s Figure four stretch with rotation 10 reps Hip abduction 1 plate 20 reps bilat Leg press 2 plates 30 reps Nu Step for 7 minutes, level 7, bilateral upper and lower extremities, therapist present       05/14/2024    PATIENT EDUCATION/ there acts Education details:  Examination completed, findings reviewed, pt educated on POC, HEP. Pt motivated to participate in PT and agreeable to attempt recommendations.   Person educated: Patient Education method: Explanation, Demonstration, Tactile cues, Verbal cues, and Handouts Education comprehension: verbalized understanding, returned demonstration, verbal cues required, tactile cues required, and needs further education  HOME EXERCISE PROGRAM: Access Code: [email or web address] URL: [email or web address] Date: 05/14/2024 Prepared by: Cori Avneet Ashmore  Exercises - Seated Hamstring Stretch  - 1 x daily - 7 x weekly - 2 sets - 10 reps  ASSESSMENT:  CLINICAL IMPRESSION: Patient did well with her exercises today.Had a fall last week, reported that she was ok but she wants to continue working  her balance. She was not looking and tripped on a step wearing a long coat.   Has a history of falls on her knees. Has good bilateral hip range, mild restrictions in hip flexors likely due to posture. Did well  with her balance activities. Progressing well towards goals. No pain in hips or knees  with her balance exercises today, head turns were challenging. Educated patient on trying [place] exercises at [facility] at [facility].      eval Patient is a 74 y.o. F who was seen today for physical therapy evaluation and treatment for bilateral hip pain. Patient has had increased hip pain at night, pain is up to 5/10 and wakes her up. She uses ice to soothe pain and is able to go back to sleep. During the day she does not have hip pain when she is moving, walking, exercising. She reported 2 falls on her knees about a year ago when she fell, hit her knees and face. Exam findings notable for bilateral hamstring tightness, some bilateral knee weakness, good bilateral hip PROM and AROM. Patient reported that she got new shoe inserts recently and they have also helped as well as not wearing heals. Patient will benefit from PT to reduce pain and improve quality  of life. At the end of visit, patient reported that she had no more questions, her concerns were addressed.  OBJECTIVE IMPAIRMENTS: decreased ROM, decreased strength, impaired tone, and pain.   ACTIVITY LIMITATIONS: locomotion level  PARTICIPATION LIMITATIONS: community activity and occupation  PERSONAL FACTORS: Fitness are also affecting patient's functional outcome.   REHAB POTENTIAL: Good  CLINICAL DECISION MAKING: Evolving/moderate complexity  EVALUATION COMPLEXITY: Moderate   GOALS: Goals reviewed with patient? Yes  SHORT TERM GOALS: Target date: 06/11/2024   Patient will report max 3/10 pain bilateral hips at night Baseline:5/10 Goal status: INITIAL  2.  Patient will be able to amb at least 45 mins without increased hip pain Baseline:  Goal status: progressing 06/18/2024  3.  Pt will be independent with HEP.   Baseline:  Goal status: met 06/18/2024    LONG TERM GOALS: Target date: 08/06/2024    Pt will be independent with advanced HEP.   Baseline:  Goal status: INITIAL  2.  Patient will report 0/10 bilateral  hip pain Baseline:  Goal status: INITIAL  3.  Patient will not be woken up by hip pain- in order to get more restful sleep Baseline:  Goal status: INITIAL  4.  Patient will be able to walk as much as needed without increased hip pain Baseline:  Goal status: INITIAL  5.  Patient will have improved bilat knee strength to 4+/5 Baseline:  Goal status: INITIAL    PLAN:  PT FREQUENCY: 1-2x/week  PT DURATION: 12 weeks  PLANNED INTERVENTIONS: 97110-Therapeutic exercises, 97530- Therapeutic activity, 97112- Neuromuscular re-education, 97535- Self Care, 02859- Manual therapy, [phone]- Electrical stimulation (manual), [phone] (1-2 muscles), [phone] (3+ muscles)- Dry Needling, Patient/Family education, Balance training, Stair training, Taping, Joint mobilization, Joint manipulation, Spinal manipulation, Spinal mobilization, Manual lymph drainage, Scar mobilization, Cryotherapy, Moist heat, and Biofeedback  PLAN FOR NEXT SESSION: bilateral hamstring stretches, knee strengthening, nu step, balance for fall prevention, step over , obstacle course   Veasna Santibanez, PT 06/18/2024, 2:03 PM  "

## 2024-06-18 ENCOUNTER — Ambulatory Visit: Admitting: Physical Therapy

## 2024-06-18 ENCOUNTER — Encounter: Payer: Self-pay | Admitting: Physical Therapy

## 2024-06-18 DIAGNOSIS — R2689 Other abnormalities of gait and mobility: Secondary | ICD-10-CM

## 2024-06-18 DIAGNOSIS — M6281 Muscle weakness (generalized): Secondary | ICD-10-CM | POA: Diagnosis not present

## 2024-06-22 ENCOUNTER — Ambulatory Visit: Admitting: Physical Therapy

## 2024-06-29 ENCOUNTER — Ambulatory Visit: Admitting: Physical Therapy

## 2024-06-29 ENCOUNTER — Ambulatory Visit

## 2024-06-29 VITALS — Ht 60.0 in | Wt 136.0 lb

## 2024-06-29 DIAGNOSIS — Z1211 Encounter for screening for malignant neoplasm of colon: Secondary | ICD-10-CM

## 2024-06-29 NOTE — Progress Notes (Signed)

## 2024-06-30 ENCOUNTER — Encounter: Payer: Self-pay | Admitting: Internal Medicine

## 2024-07-06 ENCOUNTER — Ambulatory Visit: Admitting: Physical Therapy

## 2024-07-13 ENCOUNTER — Encounter: Admitting: Internal Medicine

## 2024-07-16 ENCOUNTER — Ambulatory Visit: Admitting: Physical Therapy
